# Patient Record
Sex: Male | Born: 1965 | Race: White | Hispanic: No | Marital: Married | Smoking: Current every day smoker
Health system: Southern US, Community
[De-identification: ages and names within clinical notes are randomized; demographics above are authoritative.]

## PROBLEM LIST (undated history)

## (undated) DIAGNOSIS — I10 Essential (primary) hypertension: Secondary | ICD-10-CM

## (undated) DIAGNOSIS — R000 Tachycardia, unspecified: Secondary | ICD-10-CM

## (undated) DIAGNOSIS — E119 Type 2 diabetes mellitus without complications: Secondary | ICD-10-CM

## (undated) DIAGNOSIS — K750 Abscess of liver: Secondary | ICD-10-CM

## (undated) HISTORY — PX: INCISE AND DRAIN ABCESS: PRO64

---

## 2001-07-01 ENCOUNTER — Encounter: Payer: Self-pay | Admitting: Neurosurgery

## 2001-07-03 ENCOUNTER — Ambulatory Visit (HOSPITAL_COMMUNITY): Admission: RE | Admit: 2001-07-03 | Discharge: 2001-07-04 | Payer: Self-pay | Admitting: Neurosurgery

## 2001-07-03 ENCOUNTER — Encounter: Payer: Self-pay | Admitting: Neurosurgery

## 2004-10-02 ENCOUNTER — Inpatient Hospital Stay (HOSPITAL_COMMUNITY): Admission: RE | Admit: 2004-10-02 | Discharge: 2004-10-05 | Payer: Self-pay | Admitting: Psychiatry

## 2004-10-02 ENCOUNTER — Ambulatory Visit: Payer: Self-pay | Admitting: Psychiatry

## 2004-10-09 ENCOUNTER — Other Ambulatory Visit (HOSPITAL_COMMUNITY): Admission: RE | Admit: 2004-10-09 | Discharge: 2005-01-07 | Payer: Self-pay | Admitting: Psychiatry

## 2015-06-19 ENCOUNTER — Other Ambulatory Visit (HOSPITAL_COMMUNITY)
Admission: RE | Admit: 2015-06-19 | Discharge: 2015-06-19 | Disposition: A | Discharge status: Home / Self Care | Payer: 59 | Source: Other Acute Inpatient Hospital | Attending: *Deleted | Admitting: *Deleted

## 2015-06-19 DIAGNOSIS — Z029 Encounter for administrative examinations, unspecified: Secondary | ICD-10-CM | POA: Insufficient documentation

## 2015-06-19 LAB — COMPREHENSIVE METABOLIC PANEL
ALT: 18 U/L (ref 17–63)
AST: 25 U/L (ref 15–41)
Albumin: 2.3 g/dL — ABNORMAL LOW (ref 3.5–5.0)
Alkaline Phosphatase: 111 U/L (ref 38–126)
Anion gap: 8 (ref 5–15)
BUN: 8 mg/dL (ref 6–20)
CO2: 25 mmol/L (ref 22–32)
Calcium: 8.4 mg/dL — ABNORMAL LOW (ref 8.9–10.3)
Chloride: 100 mmol/L — ABNORMAL LOW (ref 101–111)
Creatinine, Ser: 0.59 mg/dL — ABNORMAL LOW (ref 0.61–1.24)
GFR calc Af Amer: >60 mL/min (ref >60)
GFR calc non Af Amer: >60 mL/min (ref >60)
Glucose, Bld: 135 mg/dL — ABNORMAL HIGH (ref 65–99)
Potassium: 4.4 mmol/L (ref 3.5–5.1)
Sodium: 133 mmol/L — ABNORMAL LOW (ref 135–145)
Total Bilirubin: 1.1 mg/dL (ref 0.3–1.2)
Total Protein: 6.4 g/dL — ABNORMAL LOW (ref 6.5–8.1)

## 2015-06-19 LAB — CBC WITH DIFFERENTIAL/PLATELET
Basophils Absolute: 0.1 10*3/uL (ref 0.0–0.1)
Basophils Relative: 1 % (ref 0–1)
Eosinophils Absolute: 0.6 10*3/uL (ref 0.0–0.7)
Eosinophils Relative: 6 % — ABNORMAL HIGH (ref 0–5)
HCT: 37.2 % — ABNORMAL LOW (ref 39.0–52.0)
Hemoglobin: 12.2 g/dL — ABNORMAL LOW (ref 13.0–17.0)
Lymphocytes Relative: 16 % (ref 12–46)
Lymphs Abs: 1.8 10*3/uL (ref 0.7–4.0)
MCH: 34.2 pg — ABNORMAL HIGH (ref 26.0–34.0)
MCHC: 32.8 g/dL (ref 30.0–36.0)
MCV: 104.2 fL — ABNORMAL HIGH (ref 78.0–100.0)
Monocytes Absolute: 1.1 10*3/uL — ABNORMAL HIGH (ref 0.1–1.0)
Monocytes Relative: 10 % (ref 3–12)
Neutro Abs: 7.3 10*3/uL (ref 1.7–7.7)
Neutrophils Relative %: 67 % (ref 43–77)
Platelets: 524 10*3/uL — ABNORMAL HIGH (ref 150–400)
RBC: 3.57 MIL/uL — ABNORMAL LOW (ref 4.22–5.81)
RDW: 13.5 % (ref 11.5–15.5)
WBC: 11 10*3/uL — ABNORMAL HIGH (ref 4.0–10.5)

## 2015-06-19 LAB — SEDIMENTATION RATE: Sed Rate: 120 mm/hr — ABNORMAL HIGH (ref 0–16)

## 2015-06-26 ENCOUNTER — Other Ambulatory Visit (HOSPITAL_COMMUNITY)
Admission: RE | Admit: 2015-06-26 | Discharge: 2015-06-26 | Disposition: A | Discharge status: Home / Self Care | Payer: 59 | Source: Other Acute Inpatient Hospital | Attending: Cardiovascular Disease | Admitting: Cardiovascular Disease

## 2015-06-26 DIAGNOSIS — Z5181 Encounter for therapeutic drug level monitoring: Secondary | ICD-10-CM | POA: Diagnosis not present

## 2015-06-26 DIAGNOSIS — Z452 Encounter for adjustment and management of vascular access device: Secondary | ICD-10-CM | POA: Diagnosis not present

## 2015-06-26 LAB — CBC
HCT: 39.4 % (ref 39.0–52.0)
HEMOGLOBIN: 13.2 g/dL (ref 13.0–17.0)
MCH: 34.6 pg — AB (ref 26.0–34.0)
MCHC: 33.5 g/dL (ref 30.0–36.0)
MCV: 103.1 fL — AB (ref 78.0–100.0)
PLATELETS: 391 10*3/uL (ref 150–400)
RBC: 3.82 MIL/uL — AB (ref 4.22–5.81)
RDW: 13.2 % (ref 11.5–15.5)
WBC: 11.3 10*3/uL — ABNORMAL HIGH (ref 4.0–10.5)

## 2015-06-26 LAB — COMPREHENSIVE METABOLIC PANEL
ALBUMIN: 2.8 g/dL — AB (ref 3.5–5.0)
ALK PHOS: 91 U/L (ref 38–126)
ALT: 16 U/L — AB (ref 17–63)
AST: 22 U/L (ref 15–41)
Anion gap: 7 (ref 5–15)
BUN: 8 mg/dL (ref 6–20)
CALCIUM: 8.5 mg/dL — AB (ref 8.9–10.3)
CHLORIDE: 100 mmol/L — AB (ref 101–111)
CO2: 26 mmol/L (ref 22–32)
CREATININE: 0.51 mg/dL — AB (ref 0.61–1.24)
GFR calc non Af Amer: >60 mL/min (ref >60)
GLUCOSE: 90 mg/dL (ref 65–99)
Potassium: 4.1 mmol/L (ref 3.5–5.1)
SODIUM: 133 mmol/L — AB (ref 135–145)
Total Bilirubin: 0.5 mg/dL (ref 0.3–1.2)
Total Protein: 6.9 g/dL (ref 6.5–8.1)

## 2015-06-26 LAB — C-REACTIVE PROTEIN: CRP: 1.5 mg/dL — AB (ref <1.0)

## 2015-06-26 LAB — SEDIMENTATION RATE: Sed Rate: 82 mm/hr — ABNORMAL HIGH (ref 0–16)

## 2017-03-06 ENCOUNTER — Inpatient Hospital Stay (HOSPITAL_COMMUNITY): Payer: 59

## 2017-03-06 ENCOUNTER — Inpatient Hospital Stay (HOSPITAL_COMMUNITY)
Admission: EM | Admit: 2017-03-06 | Discharge: 2017-03-21 | DRG: 871 | Disposition: E | Payer: 59 | Attending: Pulmonary Disease | Admitting: Pulmonary Disease

## 2017-03-06 ENCOUNTER — Emergency Department (HOSPITAL_COMMUNITY): Payer: 59

## 2017-03-06 ENCOUNTER — Encounter (HOSPITAL_COMMUNITY): Payer: Self-pay

## 2017-03-06 DIAGNOSIS — R0902 Hypoxemia: Secondary | ICD-10-CM | POA: Diagnosis present

## 2017-03-06 DIAGNOSIS — R6521 Severe sepsis with septic shock: Secondary | ICD-10-CM | POA: Diagnosis present

## 2017-03-06 DIAGNOSIS — D696 Thrombocytopenia, unspecified: Secondary | ICD-10-CM | POA: Diagnosis present

## 2017-03-06 DIAGNOSIS — I2510 Atherosclerotic heart disease of native coronary artery without angina pectoris: Secondary | ICD-10-CM | POA: Diagnosis present

## 2017-03-06 DIAGNOSIS — D649 Anemia, unspecified: Secondary | ICD-10-CM | POA: Diagnosis present

## 2017-03-06 DIAGNOSIS — Z7984 Long term (current) use of oral hypoglycemic drugs: Secondary | ICD-10-CM

## 2017-03-06 DIAGNOSIS — Z9861 Coronary angioplasty status: Secondary | ICD-10-CM | POA: Diagnosis not present

## 2017-03-06 DIAGNOSIS — Z452 Encounter for adjustment and management of vascular access device: Secondary | ICD-10-CM

## 2017-03-06 DIAGNOSIS — I421 Obstructive hypertrophic cardiomyopathy: Secondary | ICD-10-CM | POA: Diagnosis present

## 2017-03-06 DIAGNOSIS — A419 Sepsis, unspecified organism: Principal | ICD-10-CM | POA: Diagnosis present

## 2017-03-06 DIAGNOSIS — I10 Essential (primary) hypertension: Secondary | ICD-10-CM | POA: Diagnosis present

## 2017-03-06 DIAGNOSIS — K7200 Acute and subacute hepatic failure without coma: Secondary | ICD-10-CM | POA: Diagnosis not present

## 2017-03-06 DIAGNOSIS — I252 Old myocardial infarction: Secondary | ICD-10-CM | POA: Diagnosis not present

## 2017-03-06 DIAGNOSIS — R0600 Dyspnea, unspecified: Secondary | ICD-10-CM | POA: Diagnosis not present

## 2017-03-06 DIAGNOSIS — Z66 Do not resuscitate: Secondary | ICD-10-CM | POA: Diagnosis present

## 2017-03-06 DIAGNOSIS — Z7982 Long term (current) use of aspirin: Secondary | ICD-10-CM

## 2017-03-06 DIAGNOSIS — E874 Mixed disorder of acid-base balance: Secondary | ICD-10-CM | POA: Diagnosis present

## 2017-03-06 DIAGNOSIS — Z8249 Family history of ischemic heart disease and other diseases of the circulatory system: Secondary | ICD-10-CM

## 2017-03-06 DIAGNOSIS — N170 Acute kidney failure with tubular necrosis: Secondary | ICD-10-CM | POA: Diagnosis present

## 2017-03-06 DIAGNOSIS — E872 Acidosis, unspecified: Secondary | ICD-10-CM | POA: Insufficient documentation

## 2017-03-06 DIAGNOSIS — Z6841 Body Mass Index (BMI) 40.0 and over, adult: Secondary | ICD-10-CM | POA: Diagnosis not present

## 2017-03-06 DIAGNOSIS — Q244 Congenital subaortic stenosis: Secondary | ICD-10-CM | POA: Diagnosis not present

## 2017-03-06 DIAGNOSIS — R001 Bradycardia, unspecified: Secondary | ICD-10-CM

## 2017-03-06 DIAGNOSIS — G9341 Metabolic encephalopathy: Secondary | ICD-10-CM | POA: Diagnosis present

## 2017-03-06 DIAGNOSIS — K651 Peritoneal abscess: Secondary | ICD-10-CM | POA: Diagnosis present

## 2017-03-06 DIAGNOSIS — F1020 Alcohol dependence, uncomplicated: Secondary | ICD-10-CM | POA: Diagnosis present

## 2017-03-06 DIAGNOSIS — G9340 Encephalopathy, unspecified: Secondary | ICD-10-CM | POA: Diagnosis not present

## 2017-03-06 DIAGNOSIS — Z515 Encounter for palliative care: Secondary | ICD-10-CM | POA: Diagnosis not present

## 2017-03-06 DIAGNOSIS — J9601 Acute respiratory failure with hypoxia: Secondary | ICD-10-CM | POA: Diagnosis present

## 2017-03-06 DIAGNOSIS — E119 Type 2 diabetes mellitus without complications: Secondary | ICD-10-CM | POA: Diagnosis present

## 2017-03-06 DIAGNOSIS — K559 Vascular disorder of intestine, unspecified: Secondary | ICD-10-CM | POA: Diagnosis present

## 2017-03-06 DIAGNOSIS — E875 Hyperkalemia: Secondary | ICD-10-CM | POA: Diagnosis present

## 2017-03-06 DIAGNOSIS — I248 Other forms of acute ischemic heart disease: Secondary | ICD-10-CM | POA: Diagnosis not present

## 2017-03-06 DIAGNOSIS — F17210 Nicotine dependence, cigarettes, uncomplicated: Secondary | ICD-10-CM | POA: Diagnosis present

## 2017-03-06 DIAGNOSIS — I459 Conduction disorder, unspecified: Secondary | ICD-10-CM | POA: Diagnosis present

## 2017-03-06 DIAGNOSIS — N179 Acute kidney failure, unspecified: Secondary | ICD-10-CM | POA: Diagnosis not present

## 2017-03-06 DIAGNOSIS — N19 Unspecified kidney failure: Secondary | ICD-10-CM | POA: Insufficient documentation

## 2017-03-06 HISTORY — DX: Abscess of liver: K75.0

## 2017-03-06 HISTORY — DX: Essential (primary) hypertension: I10

## 2017-03-06 HISTORY — DX: Tachycardia, unspecified: R00.0

## 2017-03-06 HISTORY — DX: Type 2 diabetes mellitus without complications: E11.9

## 2017-03-06 LAB — BLOOD GAS, ARTERIAL
Acid-base deficit: 26.4 mmol/L — ABNORMAL HIGH (ref 0.0–2.0)
Acid-base deficit: 23.4 mmol/L — ABNORMAL HIGH (ref 0.0–2.0)
BICARBONATE: 4.4 mmol/L — AB (ref 20.0–28.0)
BICARBONATE: 6.1 mmol/L — AB (ref 20.0–28.0)
Drawn by: 404151
Drawn by: 27022
FIO2: 50
FIO2: 0.5
LHR: 35 {breaths}/min
LHR: 35 {breaths}/min
MECHVT: 600 mL
MECHVT: 600 mL
O2 SAT: 94.8 %
O2 Saturation: 92 %
PATIENT TEMPERATURE: 95.9
PCO2 ART: 27.4 mmHg — AB (ref 32.0–48.0)
PEEP/CPAP: 5 cmH2O
PEEP: 5 cmH2O
PO2 ART: 88.5 mmHg (ref 83.0–108.0)
Patient temperature: 98.6
pCO2 arterial: 28.4 mmHg — ABNORMAL LOW (ref 32.0–48.0)
pH, Arterial: 6.82 — CL (ref 7.350–7.450)
pH, Arterial: 6.966 — CL (ref 7.350–7.450)
pO2, Arterial: 91.1 mmHg (ref 83.0–108.0)

## 2017-03-06 LAB — I-STAT CG4 LACTIC ACID, ED: LACTIC ACID, VENOUS: 15.01 mmol/L — AB (ref 0.5–1.9)

## 2017-03-06 LAB — RENAL FUNCTION PANEL
Albumin: 2.4 g/dL — ABNORMAL LOW (ref 3.5–5.0)
BUN: 73 mg/dL — ABNORMAL HIGH (ref 6–20)
CALCIUM: 6.7 mg/dL — AB (ref 8.9–10.3)
CHLORIDE: 95 mmol/L — AB (ref 101–111)
CREATININE: 11.32 mg/dL — AB (ref 0.61–1.24)
GFR calc Af Amer: 5 mL/min — ABNORMAL LOW (ref >60)
GFR calc non Af Amer: 5 mL/min — ABNORMAL LOW (ref >60)
GLUCOSE: 241 mg/dL — AB (ref 65–99)
Phosphorus: 16.2 mg/dL — ABNORMAL HIGH (ref 2.5–4.6)
Potassium: >7.5 mmol/L (ref 3.5–5.1)
SODIUM: 135 mmol/L (ref 135–145)

## 2017-03-06 LAB — CBC WITH DIFFERENTIAL/PLATELET
BASOS PCT: 0 %
Basophils Absolute: 0 10*3/uL (ref 0.0–0.1)
EOS ABS: 0 10*3/uL (ref 0.0–0.7)
EOS PCT: 0 %
HCT: 31.1 % — ABNORMAL LOW (ref 39.0–52.0)
Hemoglobin: 9.2 g/dL — ABNORMAL LOW (ref 13.0–17.0)
LYMPHS ABS: 1.7 10*3/uL (ref 0.7–4.0)
Lymphocytes Relative: 30 %
MCH: 36.5 pg — AB (ref 26.0–34.0)
MCHC: 29.6 g/dL — ABNORMAL LOW (ref 30.0–36.0)
MCV: 123.4 fL — ABNORMAL HIGH (ref 78.0–100.0)
MONO ABS: 0.4 10*3/uL (ref 0.1–1.0)
Monocytes Relative: 7 %
Neutro Abs: 3.5 10*3/uL (ref 1.7–7.7)
Neutrophils Relative %: 63 %
PLATELETS: 89 10*3/uL — AB (ref 150–400)
RBC: 2.52 MIL/uL — AB (ref 4.22–5.81)
RDW: 12.8 % (ref 11.5–15.5)
WBC: 5.6 10*3/uL (ref 4.0–10.5)

## 2017-03-06 LAB — CBC
HCT: 32.9 % — ABNORMAL LOW (ref 39.0–52.0)
HEMOGLOBIN: 10.1 g/dL — AB (ref 13.0–17.0)
MCH: 36.9 pg — AB (ref 26.0–34.0)
MCHC: 30.7 g/dL (ref 30.0–36.0)
MCV: 120.1 fL — ABNORMAL HIGH (ref 78.0–100.0)
PLATELETS: 85 10*3/uL — AB (ref 150–400)
RBC: 2.74 MIL/uL — AB (ref 4.22–5.81)
RDW: 13.1 % (ref 11.5–15.5)
WBC: 8.1 10*3/uL (ref 4.0–10.5)

## 2017-03-06 LAB — COMPREHENSIVE METABOLIC PANEL
ALBUMIN: 2.6 g/dL — AB (ref 3.5–5.0)
ALT: 50 U/L (ref 17–63)
ALT: 58 U/L (ref 17–63)
AST: 103 U/L — ABNORMAL HIGH (ref 15–41)
AST: 137 U/L — ABNORMAL HIGH (ref 15–41)
Albumin: 2.5 g/dL — ABNORMAL LOW (ref 3.5–5.0)
Alkaline Phosphatase: 100 U/L (ref 38–126)
Alkaline Phosphatase: 109 U/L (ref 38–126)
BUN: 75 mg/dL — ABNORMAL HIGH (ref 6–20)
BUN: 74 mg/dL — ABNORMAL HIGH (ref 6–20)
CHLORIDE: 97 mmol/L — AB (ref 101–111)
CHLORIDE: 99 mmol/L — AB (ref 101–111)
CO2: <7 mmol/L — ABNORMAL LOW (ref 22–32)
Calcium: 7.5 mg/dL — ABNORMAL LOW (ref 8.9–10.3)
Calcium: 7.3 mg/dL — ABNORMAL LOW (ref 8.9–10.3)
Creatinine, Ser: 12.4 mg/dL — ABNORMAL HIGH (ref 0.61–1.24)
Creatinine, Ser: 11.88 mg/dL — ABNORMAL HIGH (ref 0.61–1.24)
GFR calc Af Amer: 5 mL/min — ABNORMAL LOW (ref >60)
GFR calc Af Amer: 5 mL/min — ABNORMAL LOW (ref >60)
GFR calc non Af Amer: 4 mL/min — ABNORMAL LOW (ref >60)
GFR, EST NON AFRICAN AMERICAN: 4 mL/min — AB (ref >60)
Glucose, Bld: 115 mg/dL — ABNORMAL HIGH (ref 65–99)
Glucose, Bld: 155 mg/dL — ABNORMAL HIGH (ref 65–99)
SODIUM: 136 mmol/L (ref 135–145)
Sodium: 138 mmol/L (ref 135–145)
Total Bilirubin: 1.3 mg/dL — ABNORMAL HIGH (ref 0.3–1.2)
Total Bilirubin: 1.7 mg/dL — ABNORMAL HIGH (ref 0.3–1.2)
Total Protein: 5 g/dL — ABNORMAL LOW (ref 6.5–8.1)
Total Protein: 5.1 g/dL — ABNORMAL LOW (ref 6.5–8.1)

## 2017-03-06 LAB — I-STAT CHEM 8, ED
BUN: 86 mg/dL — ABNORMAL HIGH (ref 6–20)
CALCIUM ION: 0.82 mmol/L — AB (ref 1.15–1.40)
Chloride: 106 mmol/L (ref 101–111)
Creatinine, Ser: 13.5 mg/dL — ABNORMAL HIGH (ref 0.61–1.24)
GLUCOSE: 71 mg/dL (ref 65–99)
HCT: 46 % (ref 39.0–52.0)
HEMOGLOBIN: 15.6 g/dL (ref 13.0–17.0)
Potassium: 7 mmol/L (ref 3.5–5.1)
SODIUM: 132 mmol/L — AB (ref 135–145)
TCO2: 5 mmol/L (ref 0–100)

## 2017-03-06 LAB — GLUCOSE, CAPILLARY
Glucose-Capillary: 244 mg/dL — ABNORMAL HIGH (ref 65–99)
Glucose-Capillary: 261 mg/dL — ABNORMAL HIGH (ref 65–99)
Glucose-Capillary: 303 mg/dL — ABNORMAL HIGH (ref 65–99)

## 2017-03-06 LAB — LIPASE, BLOOD
LIPASE: 770 U/L — AB (ref 11–51)
Lipase: 755 U/L — ABNORMAL HIGH (ref 11–51)

## 2017-03-06 LAB — APTT: APTT: 56 s — AB (ref 24–36)

## 2017-03-06 LAB — LACTIC ACID, PLASMA
LACTIC ACID, VENOUS: 15.7 mmol/L — AB (ref 0.5–1.9)
Lactic Acid, Venous: 15 mmol/L (ref 0.5–1.9)

## 2017-03-06 LAB — I-STAT ARTERIAL BLOOD GAS, ED
ACID-BASE DEFICIT: 29 mmol/L — AB (ref 0.0–2.0)
BICARBONATE: 5.5 mmol/L — AB (ref 20.0–28.0)
O2 Saturation: 100 %
PH ART: 6.728 — AB (ref 7.350–7.450)
TCO2: 7 mmol/L (ref 0–100)
pCO2 arterial: 41.4 mmHg (ref 32.0–48.0)
pO2, Arterial: 363 mmHg — ABNORMAL HIGH (ref 83.0–108.0)

## 2017-03-06 LAB — ETHANOL: ALCOHOL ETHYL (B): 186 mg/dL — AB (ref <5)

## 2017-03-06 LAB — LACTATE DEHYDROGENASE: LDH: 367 U/L — AB (ref 98–192)

## 2017-03-06 LAB — TROPONIN I: TROPONIN I: 0.09 ng/mL — AB (ref <0.03)

## 2017-03-06 LAB — MAGNESIUM: Magnesium: 2.6 mg/dL — ABNORMAL HIGH (ref 1.7–2.4)

## 2017-03-06 LAB — AMYLASE: AMYLASE: 527 U/L — AB (ref 28–100)

## 2017-03-06 LAB — PHOSPHORUS: Phosphorus: 14.7 mg/dL — ABNORMAL HIGH (ref 2.5–4.6)

## 2017-03-06 LAB — TYPE AND SCREEN
ABO/RH(D): A POS
Antibody Screen: NEGATIVE

## 2017-03-06 LAB — PROCALCITONIN: PROCALCITONIN: 0.39 ng/mL

## 2017-03-06 LAB — I-STAT TROPONIN, ED: Troponin i, poc: 0.13 ng/mL (ref 0.00–0.08)

## 2017-03-06 LAB — PROTIME-INR
INR: 2.29
Prothrombin Time: 25.6 seconds — ABNORMAL HIGH (ref 11.4–15.2)

## 2017-03-06 LAB — CORTISOL: Cortisol, Plasma: 25.5 ug/dL

## 2017-03-06 LAB — ABO/RH: ABO/RH(D): A POS

## 2017-03-06 LAB — CBG MONITORING, ED: GLUCOSE-CAPILLARY: 62 mg/dL — AB (ref 65–99)

## 2017-03-06 MED ORDER — SODIUM CHLORIDE 0.9 % IV BOLUS (SEPSIS)
1000.0000 mL | Freq: Once | INTRAVENOUS | Status: AC
  Administered 2017-03-06: 1000 mL via INTRAVENOUS

## 2017-03-06 MED ORDER — PANTOPRAZOLE SODIUM 40 MG IV SOLR
40.0000 mg | Freq: Every day | INTRAVENOUS | Status: DC
  Administered 2017-03-06 – 2017-03-07 (×2): 40 mg via INTRAVENOUS
  Filled 2017-03-06 (×2): qty 40

## 2017-03-06 MED ORDER — SODIUM CHLORIDE 0.9 % IV SOLN
5.0000 ng/kg/min | INTRAVENOUS | Status: DC
  Filled 2017-03-06 (×2): qty 1

## 2017-03-06 MED ORDER — DEXTROSE 5 % IV SOLN
2.0000 g | Freq: Two times a day (BID) | INTRAVENOUS | Status: DC
  Filled 2017-03-06: qty 2

## 2017-03-06 MED ORDER — SODIUM BICARBONATE 8.4 % IV SOLN
50.0000 meq | Freq: Once | INTRAVENOUS | Status: AC
  Administered 2017-03-06: 50 meq via INTRAVENOUS

## 2017-03-06 MED ORDER — CALCIUM CHLORIDE 10 % IV SOLN: 1.0000 g | Freq: Once | INTRAVENOUS | Status: DC

## 2017-03-06 MED ORDER — SODIUM BICARBONATE 8.4 % IV SOLN
200.0000 meq | Freq: Once | INTRAVENOUS | Status: AC
Start: 2017-03-06 — End: 2017-03-06
  Administered 2017-03-06: 200 meq via INTRAVENOUS
  Filled 2017-03-06: qty 200

## 2017-03-06 MED ORDER — MIDAZOLAM HCL 2 MG/2ML IJ SOLN
2.0000 mg | INTRAMUSCULAR | Status: AC | PRN
  Administered 2017-03-07 (×3): 2 mg via INTRAVENOUS
  Filled 2017-03-06 (×3): qty 2

## 2017-03-06 MED ORDER — SODIUM CHLORIDE 0.9 % FOR CRRT
INTRAVENOUS_CENTRAL | Status: DC | PRN
  Filled 2017-03-06: qty 1000

## 2017-03-06 MED ORDER — NOREPINEPHRINE BITARTRATE 1 MG/ML IV SOLN
0.0000 ug/min | INTRAVENOUS | Status: DC
  Administered 2017-03-06: 30 ug/min via INTRAVENOUS
  Administered 2017-03-06: 2 ug/min via INTRAVENOUS
  Filled 2017-03-06 (×2): qty 4

## 2017-03-06 MED ORDER — ATROPINE SULFATE 1 MG/10ML IJ SOSY
PREFILLED_SYRINGE | INTRAMUSCULAR | Status: AC
  Filled 2017-03-06: qty 10

## 2017-03-06 MED ORDER — SODIUM BICARBONATE 8.4 % IV SOLN
INTRAVENOUS | Status: AC | PRN
  Administered 2017-03-06: 50 meq via INTRAVENOUS

## 2017-03-06 MED ORDER — SODIUM BICARBONATE 8.4 % IV SOLN
200.0000 meq | Freq: Once | INTRAVENOUS | Status: AC
  Administered 2017-03-06: 200 meq via INTRAVENOUS
  Filled 2017-03-06: qty 200

## 2017-03-06 MED ORDER — PROPOFOL 1000 MG/100ML IV EMUL: 5.0000 ug/kg/min | Freq: Once | INTRAVENOUS | Status: DC

## 2017-03-06 MED ORDER — CHLORHEXIDINE GLUCONATE 0.12% ORAL RINSE (MEDLINE KIT)
15.0000 mL | Freq: Two times a day (BID) | OROMUCOSAL | Status: DC
  Administered 2017-03-06 – 2017-03-08 (×4): 15 mL via OROMUCOSAL

## 2017-03-06 MED ORDER — HEPARIN SODIUM (PORCINE) 5000 UNIT/ML IJ SOLN
5000.0000 [IU] | Freq: Three times a day (TID) | INTRAMUSCULAR | Status: DC
  Administered 2017-03-06 – 2017-03-07 (×2): 5000 [IU] via SUBCUTANEOUS
  Filled 2017-03-06 (×3): qty 1

## 2017-03-06 MED ORDER — VASOPRESSIN 20 UNIT/ML IV SOLN
0.0300 [IU]/min | INTRAVENOUS | Status: DC
  Administered 2017-03-06 – 2017-03-08 (×3): 0.03 [IU]/min via INTRAVENOUS
  Filled 2017-03-06 (×3): qty 2

## 2017-03-06 MED ORDER — EPINEPHRINE PF 1 MG/ML IJ SOLN
0.5000 ug/min | INTRAMUSCULAR | Status: DC
  Administered 2017-03-06 – 2017-03-07 (×2): 10 ug/min via INTRAVENOUS
  Administered 2017-03-07 (×2): 20 ug/min via INTRAVENOUS
  Filled 2017-03-06 (×5): qty 4

## 2017-03-06 MED ORDER — DEXTROSE 50 % IV SOLN: 50.0000 mL | Freq: Once | INTRAVENOUS | Status: DC

## 2017-03-06 MED ORDER — NOREPINEPHRINE BITARTRATE 1 MG/ML IV SOLN
0.0000 ug/min | INTRAVENOUS | Status: DC
  Administered 2017-03-06 (×3): 125 ug/min via INTRAVENOUS
  Administered 2017-03-06: 40 ug/min via INTRAVENOUS
  Administered 2017-03-07: 65 ug/min via INTRAVENOUS
  Administered 2017-03-07 (×2): 125 ug/min via INTRAVENOUS
  Administered 2017-03-07: 60 ug/min via INTRAVENOUS
  Administered 2017-03-07: 40 ug/min via INTRAVENOUS
  Administered 2017-03-07: 125 ug/min via INTRAVENOUS
  Administered 2017-03-08: 80 ug/min via INTRAVENOUS
  Administered 2017-03-08 (×2): 100 ug/min via INTRAVENOUS
  Filled 2017-03-06 (×14): qty 16

## 2017-03-06 MED ORDER — FENTANYL CITRATE (PF) 100 MCG/2ML IJ SOLN
INTRAMUSCULAR | Status: AC | PRN
  Administered 2017-03-06: 100 ug via INTRAVENOUS

## 2017-03-06 MED ORDER — SODIUM BICARBONATE 8.4 % IV SOLN
INTRAVENOUS | Status: AC
  Administered 2017-03-06: 15:00:00
  Filled 2017-03-06: qty 50

## 2017-03-06 MED ORDER — SUCCINYLCHOLINE CHLORIDE 20 MG/ML IJ SOLN
INTRAMUSCULAR | Status: AC | PRN
  Administered 2017-03-06: 200 mg via INTRAVENOUS

## 2017-03-06 MED ORDER — DEXTROSE 5 % IV SOLN: 1.0000 g | INTRAVENOUS | Status: DC

## 2017-03-06 MED ORDER — VANCOMYCIN HCL 10 G IV SOLR
2500.0000 mg | Freq: Once | INTRAVENOUS | Status: AC
  Administered 2017-03-06: 2500 mg via INTRAVENOUS
  Filled 2017-03-06: qty 2500

## 2017-03-06 MED ORDER — PRISMASOL BGK 0/2.5 32-2.5 MEQ/L IV SOLN
INTRAVENOUS | Status: DC
  Administered 2017-03-06: 17:00:00 via INTRAVENOUS_CENTRAL
  Filled 2017-03-06 (×2): qty 5000

## 2017-03-06 MED ORDER — SODIUM BICARBONATE 8.4 % IV SOLN
INTRAVENOUS | Status: AC
  Filled 2017-03-06: qty 50

## 2017-03-06 MED ORDER — SODIUM CHLORIDE 0.9 % IV BOLUS (SEPSIS)
500.0000 mL | Freq: Once | INTRAVENOUS | Status: AC
  Administered 2017-03-06: 500 mL via INTRAVENOUS

## 2017-03-06 MED ORDER — SODIUM CHLORIDE 0.9 % IV SOLN
100.0000 mg | Freq: Once | INTRAVENOUS | Status: AC
  Administered 2017-03-06: 100 mg via INTRAVENOUS
  Filled 2017-03-06: qty 100

## 2017-03-06 MED ORDER — NALOXONE HCL 2 MG/2ML IJ SOSY
PREFILLED_SYRINGE | INTRAMUSCULAR | Status: AC
  Filled 2017-03-06: qty 2

## 2017-03-06 MED ORDER — HYDROCORTISONE NA SUCCINATE PF 100 MG IJ SOLR
100.0000 mg | Freq: Three times a day (TID) | INTRAMUSCULAR | Status: DC
  Administered 2017-03-06 – 2017-03-07 (×3): 100 mg via INTRAVENOUS
  Filled 2017-03-06 (×4): qty 2

## 2017-03-06 MED ORDER — SODIUM CHLORIDE 0.9 % IV SOLN: 50.0000 mg | INTRAVENOUS | Status: DC

## 2017-03-06 MED ORDER — SODIUM CHLORIDE 0.9 % IV SOLN: 250.0000 mL | INTRAVENOUS | Status: DC | PRN

## 2017-03-06 MED ORDER — MIDAZOLAM HCL 2 MG/2ML IJ SOLN
2.0000 mg | INTRAMUSCULAR | Status: DC | PRN
  Administered 2017-03-06 – 2017-03-08 (×10): 2 mg via INTRAVENOUS
  Filled 2017-03-06 (×10): qty 2

## 2017-03-06 MED ORDER — STERILE WATER FOR INJECTION IV SOLN
INTRAVENOUS | Status: DC
  Administered 2017-03-07 – 2017-03-08 (×5): via INTRAVENOUS_CENTRAL
  Filled 2017-03-06 (×10): qty 300

## 2017-03-06 MED ORDER — DEXTROSE 5 % IV SOLN
INTRAVENOUS | Status: DC
  Administered 2017-03-06 – 2017-03-08 (×6): via INTRAVENOUS
  Filled 2017-03-06 (×10): qty 150

## 2017-03-06 MED ORDER — STERILE WATER FOR INJECTION IV SOLN
INTRAVENOUS | Status: DC
  Filled 2017-03-06 (×4): qty 150

## 2017-03-06 MED ORDER — PRISMASOL BGK 0/2.5 32-2.5 MEQ/L IV SOLN
INTRAVENOUS | Status: DC
Start: 2017-03-06 — End: 2017-03-07
  Administered 2017-03-06 – 2017-03-07 (×3): via INTRAVENOUS_CENTRAL
  Filled 2017-03-06 (×2): qty 5000

## 2017-03-06 MED ORDER — DEXTROSE 5 % IV SOLN
2.0000 g | Freq: Once | INTRAVENOUS | Status: AC
  Administered 2017-03-06: 2 g via INTRAVENOUS

## 2017-03-06 MED ORDER — SODIUM CHLORIDE 0.9 % IV SOLN
1.0000 g | Freq: Two times a day (BID) | INTRAVENOUS | Status: DC
  Administered 2017-03-06 – 2017-03-08 (×4): 1 g via INTRAVENOUS
  Filled 2017-03-06 (×4): qty 1

## 2017-03-06 MED ORDER — PRISMASOL BGK 0/2.5 32-2.5 MEQ/L IV SOLN
INTRAVENOUS | Status: DC
  Administered 2017-03-06 – 2017-03-07 (×5): via INTRAVENOUS_CENTRAL
  Filled 2017-03-06 (×11): qty 5000

## 2017-03-06 MED ORDER — FENTANYL 2500MCG IN NS 250ML (10MCG/ML) PREMIX INFUSION
25.0000 ug/h | INTRAVENOUS | Status: DC
  Administered 2017-03-06: 25 ug/h via INTRAVENOUS
  Administered 2017-03-07 – 2017-03-08 (×2): 200 ug/h via INTRAVENOUS
  Filled 2017-03-06 (×4): qty 250

## 2017-03-06 MED ORDER — VANCOMYCIN HCL 10 G IV SOLR
1250.0000 mg | INTRAVENOUS | Status: DC
  Administered 2017-03-07 – 2017-03-08 (×2): 1250 mg via INTRAVENOUS
  Filled 2017-03-06 (×2): qty 1250

## 2017-03-06 MED ORDER — SODIUM CHLORIDE 0.9 % IV SOLN
Freq: Once | INTRAVENOUS | Status: AC
  Administered 2017-03-06: 08:00:00 via INTRAVENOUS

## 2017-03-06 MED ORDER — INSULIN ASPART 100 UNIT/ML ~~LOC~~ SOLN
0.0000 [IU] | SUBCUTANEOUS | Status: DC
  Administered 2017-03-06: 5 [IU] via SUBCUTANEOUS
  Administered 2017-03-06: 7 [IU] via SUBCUTANEOUS
  Administered 2017-03-07: 9 [IU] via SUBCUTANEOUS
  Administered 2017-03-07: 7 [IU] via SUBCUTANEOUS

## 2017-03-06 MED ORDER — DEXTROSE 50 % IV SOLN
INTRAVENOUS | Status: AC | PRN
  Administered 2017-03-06: 1 via INTRAVENOUS

## 2017-03-06 MED ORDER — ROCURONIUM BROMIDE 50 MG/5ML IV SOLN
INTRAVENOUS | Status: AC | PRN
  Administered 2017-03-06: 50 mg via INTRAVENOUS

## 2017-03-06 MED ORDER — THIAMINE HCL 100 MG/ML IJ SOLN
100.0000 mg | Freq: Once | INTRAMUSCULAR | Status: AC
  Administered 2017-03-06: 100 mg via INTRAVENOUS
  Filled 2017-03-06: qty 2

## 2017-03-06 MED ORDER — ORAL CARE MOUTH RINSE
15.0000 mL | Freq: Four times a day (QID) | OROMUCOSAL | Status: DC
  Administered 2017-03-06 – 2017-03-08 (×6): 15 mL via OROMUCOSAL

## 2017-03-06 MED ORDER — FENTANYL CITRATE (PF) 100 MCG/2ML IJ SOLN
INTRAMUSCULAR | Status: AC
  Filled 2017-03-06: qty 2

## 2017-03-06 MED ORDER — NALOXONE HCL 2 MG/2ML IJ SOSY
PREFILLED_SYRINGE | INTRAMUSCULAR | Status: AC | PRN
  Administered 2017-03-06: 2 mg via INTRAVENOUS

## 2017-03-06 MED ORDER — SODIUM BICARBONATE 8.4 % IV SOLN: 200.0000 meq | Freq: Once | INTRAVENOUS | Status: DC

## 2017-03-06 MED ORDER — FENTANYL BOLUS VIA INFUSION
50.0000 ug | INTRAVENOUS | Status: DC | PRN
  Administered 2017-03-07: 50 ug via INTRAVENOUS
  Filled 2017-03-06: qty 50

## 2017-03-06 MED ORDER — HEPARIN SODIUM (PORCINE) 1000 UNIT/ML DIALYSIS
1000.0000 [IU] | INTRAMUSCULAR | Status: DC | PRN
  Filled 2017-03-06: qty 6

## 2017-03-06 MED ORDER — CALCIUM CHLORIDE 10 % IV SOLN
INTRAVENOUS | Status: AC | PRN
  Administered 2017-03-06: 1 g via INTRAVENOUS

## 2017-03-06 MED ORDER — SODIUM BICARBONATE 8.4 % IV SOLN
200.0000 meq | Freq: Once | INTRAVENOUS | Status: DC
  Filled 2017-03-06: qty 200

## 2017-03-06 MED ORDER — PANTOPRAZOLE SODIUM 40 MG IV SOLR: 40.0000 mg | Freq: Every day | INTRAVENOUS | Status: DC

## 2017-03-06 MED ORDER — FENTANYL CITRATE (PF) 100 MCG/2ML IJ SOLN: 50.0000 ug | Freq: Once | INTRAMUSCULAR | Status: DC

## 2017-03-06 MED ORDER — ETOMIDATE 2 MG/ML IV SOLN
INTRAVENOUS | Status: AC | PRN
  Administered 2017-03-06: 20 mg via INTRAVENOUS

## 2017-03-06 NOTE — Progress Notes (Signed)
Dr Tyson AliasFeinstein aware of ABG results = at bedside.  Per MD, increase set vent rate to 35 s/p ABG= done.

## 2017-03-06 NOTE — Progress Notes (Signed)
Patient is poorly responsive to pressors.  Called back by bed side RN.  Family bedside.  Spoke with wife who is a Transport plannerpathology tech.  Informed her that he has a severe pneumonia and that he is in multiorgan system failure.  Informed her that his chances of survival are very slim here and that he is in renal failure.  Spoke with renal and they would dialyze if family wishes for aggressive measures.  After a long conversation, decision was made to make patient a LCB with no CPR/cardioversion and short term intubation and dialysis.  If he is not improving then likely withdraw.  Will make LCB.  Dr. Hyman HopesWebb will place CRRT orders and PCCM will place HD catheter.  The patient is critically ill with multiple organ systems failure and requires high complexity decision making for assessment and support, frequent evaluation and titration of therapies, application of advanced monitoring technologies and extensive interpretation of multiple databases.   Critical Care Time devoted to patient care services described in this note is  60  Minutes. This time reflects time of care of this signee Dr Koren BoundWesam Yacoub. This critical care time does not reflect procedure time, or teaching time or supervisory time of PA/NP/Med student/Med Resident etc but could involve care discussion time.  Alyson ReedyWesam G. Yacoub, M.D. [facility]. Pager: [phone]. After hours pager: [phone].

## 2017-03-06 NOTE — Progress Notes (Signed)
Dr. Arsenio LoaderSommer notified about pt's 2050 ABG, orders given and carried out by nurse.

## 2017-03-06 NOTE — ED Notes (Signed)
Cardiology at bedside.

## 2017-03-06 NOTE — Progress Notes (Signed)
eLink Physician-Brief Progress Note Patient Name: Andres RileyJohn R Cardon Jr. DOB: May 08, 1966 MRN: [id number]   Date of Service  03/07/2017  HPI/Events of Note  ABG on 50%/PRVC 35/TV 600/P 5 = 6.966/27.4/88.5/6.1  eICU Interventions  Will order: 1. NaHCO3 200 meq IV now. 2. Increase NaHCO3 IV infusion to 175 mL/hour. 3. ABG at 12 midnight.      Intervention Category Major Interventions: Acid-Base disturbance - evaluation and management;Respiratory failure - evaluation and management  Lenell AntuSommer,Gentri Guardado Eugene 03/09/2017, 10:59 PM

## 2017-03-06 NOTE — ED Triage Notes (Signed)
Pt presents via [facility] for evaluation of sob and bradycardia. EMS reports pt called out for sob and bilateral side pain. States pt was axo x4 on arrival, states pt was bradycardic at 34 HR. Pt then had unresponsive episode requiring assisted ventilation. EMS was pacing pt at 70 bpm. Pt no longer paced on arrival to ED. Pt GCS 5. Pt given 324 ASA in route prior to unresponsive episode.

## 2017-03-06 NOTE — Procedures (Signed)
Central Venous  Dialysis Catheter Insertion Procedure Note Andres RileyJohn R Blasingame Jr. [id number] [birth year]  Procedure: Insertion of Central Venous Dialysis Catheter Indications: Dialysis  Procedure Details Consent: Risks of procedure as well as the alternatives and risks of each were explained to the (patient/caregiver).  Consent for procedure obtained. Time Out: Verified patient identification, verified procedure, site/side was marked, verified correct patient position, special equipment/implants available, medications/allergies/relevent history reviewed, required imaging and test results available.  Performed  Maximum sterile technique was used including antiseptics, cap, gloves, gown, hand hygiene, mask and sheet. Skin prep: Chlorhexidine; local anesthetic administered A antimicrobial bonded/coated triple lumen catheter was placed in the right internal jugular vein using the Seldinger technique.  Biopatch applied.   Evaluation Blood flow good Complications: No apparent complications Patient did tolerate procedure well. Chest X-ray ordered to verify placement.  CXR: pending.  Procedure performed with ultrasound guidance for real time vessel cannulation.     Andres BoyerBrooke Sanchez, AGACNP-BC [facility] Pgr: [phone] or if no answer [phone] 10-01-17, 3:37 PM  Andres ReedyWesam G. Sanchez, M.D. [facility]. Pager: [phone]. After hours pager: [phone].

## 2017-03-06 NOTE — Progress Notes (Addendum)
Patient ID: Andres RileyJohn R Martos Jr., male   DOB: [birth year], 51 y.o.   MRN: [id number]  S- levophed from 40 to 100, last abg acidotic, DNR established O- Vitals:   02/26/2017 1630 03/19/2017 1700  BP: (!) 63/18 (!) 75/21  Pulse: 71 77  Resp: (!) 32 (!) 32  Temp: 97.5 F (36.4 C) 97.5 F (36.4 C)     General: not awake,  Neuro: perr4 mm sluggish, slight open eyes HEENT: lines clean, jvd down PULM: coarse distant CV:  s1 s2 RRR distant GI: soft, hypoactive BS, no r/g, no organomegally Extremities:  Edema mild  A MODS Septic shock likely ( unknown source) Acute resp failure, r/o impending PNA, some hilar hazziness ARF, ATN likely R/o abdominal source ( exam NOT c/w, but CT not with contrast) Refractory lactic acidosis  - he is worsening with shock, cvvhd about to start -the CT abdo was neg but done without contrast, his exam is soft, but the degree of shock is concerning for bowel ischemia -need echo to r/o cardiogenic component -levophed to 125 -stat ABG -consider addition angiotensin 2 if repeat abg showing some progress and feel this is reversible? -vasopressin continued -if angiotensin added then add sub q hep regardless of coags noted as risk dvt higher  -repeat coags in am  -change cefepime ( no anaerobic) to meropenem -add empiric antifungal coverage -family at bedside, I updated them -ABG q4h -cvp noted 13, but we dont know his pa pressures, will bolus again -consider swan -bicarb for now drip -lactic repeat -repeat lipase -add fent drip, appears dyschrony -too unstable to transport back for CT with contrast  Ccm time 45 min   Mcarthur Rossettianiel J. Tyson AliasFeinstein, MD, FACP Pgr: [phone] [place] [place] [facility]

## 2017-03-06 NOTE — Progress Notes (Signed)
eLink Physician-Brief Progress Note Patient Name: Andres RileyJohn R Brouwer Jr. DOB: [birth year] MRN: [id number]   Date of Service  03/13/2017  HPI/Events of Note  ABG on 50%/PRVC 35/TV 600/P 5 = 6.82/28.4/91.1/4.4  eICU Interventions  Will order: 1. NaHCO3 200 meq IV now. 2. Increase NaHCO3 IV infusion to 150 meq/hour. 3. Repeat ABG at 10:30 PM.     Intervention Category Major Interventions: Acid-Base disturbance - evaluation and management;Respiratory failure - evaluation and management  Korinne Greenstein Eugene 02/24/2017, 9:15 PM

## 2017-03-06 NOTE — Consult Note (Signed)
Referring Provider: No ref. provider found Primary Care Physician:  Patient, No Pcp Per Primary Nephrologist:     Reason for Consultation:   Shock with multiorgan failure and acute renal failure  Hyperkalemia   HPI: 51 year old diabetic with tobacco and alcohol abuse was brought to the Er with EMS and found to be bradycardic His creatinine was 12 and K 7.2  He developed unresponsiveness and developed respiratory failure   He had some infiltrates on CT  He was also found to have some mesenteric haziness in abdomen  Lactate was 15 Trop 0.13  No hydro on renal ultrasound  He was taking ACE inhibitor as an outpatient   Past Medical History:  Diagnosis Date  . Diabetes mellitus without complication (HCC)   . Hypertension   . Liver abscess   . Tachycardia     Past Surgical History:  Procedure Laterality Date  . INCISE AND DRAIN ABCESS     liver X 2    Prior to Admission medications   Medication Sig Start Date End Date Taking? Authorizing Provider  aspirin 81 MG chewable tablet Chew 81 mg by mouth daily.   Yes [provider]  atorvastatin (LIPITOR) 40 MG tablet Take 40 mg by mouth daily.   Yes [provider]  benazepril (LOTENSIN) 5 MG tablet Take 5 mg by mouth daily.   Yes [provider]  folic acid (FOLVITE) 1 MG tablet Take 1 mg by mouth daily.   Yes [provider]  metFORMIN (GLUCOPHAGE) 1000 MG tablet Take 1,000 mg by mouth 2 (two) times daily with a meal.   Yes [provider]  metoprolol succinate (TOPROL-XL) 50 MG 24 hr tablet Take 50 mg by mouth 2 (two) times daily. Take with or immediately following a meal.   Yes [provider]  Multiple Vitamin (MULTIVITAMIN) tablet Take 1 tablet by mouth daily.   Yes [provider]  ranitidine (ZANTAC) 150 MG tablet Take 150 mg by mouth at bedtime.    Yes [provider]  traMADol (ULTRAM) 50 MG tablet Take 50 mg by mouth every 4 (four) hours as needed for  moderate pain.   Yes [provider]    Current Facility-Administered Medications  Medication Dose Route Frequency Provider Last Rate Last Dose  . 0.9 %  sodium chloride infusion  250 mL Intravenous PRN Minor, Vilinda Blanks, NP      . calcium chloride injection 1 g  1 g Intravenous Once Rolland Porter, MD      . Melene Muller ON 03/07/2017] ceFEPIme (MAXIPIME) 1 g in dextrose 5 % 50 mL IVPB  1 g Intravenous Q24H MastersDarl Householder, [facility]      . dextrose 50 % solution 50 mL  50 mL Intravenous Once Rolland Porter, MD      . fentaNYL (SUBLIMAZE) 100 MCG/2ML injection           . hydrocortisone sodium succinate (SOLU-CORTEF) 100 MG injection 100 mg  100 mg Intravenous Q8H Minor, Vilinda Blanks, NP   100 mg at 03/07/17 1149  . norepinephrine (LEVOPHED) 4 mg in dextrose 5 % 250 mL (0.016 mg/mL) infusion  0-40 mcg/min Intravenous Titrated Minor, Vilinda Blanks, NP 131.3 mL/hr at 2017-03-07 1357 35 mcg/min at 2017/03/07 1357  . pantoprazole (PROTONIX) injection 40 mg  40 mg Intravenous QHS Minor, Vilinda Blanks, NP      . propofol (DIPRIVAN) 1000 MG/100ML infusion  5-70 mcg/kg/min Intravenous Once Rolland Porter, MD   Stopped at 03-07-2017 1023  .  sodium bicarbonate 150 mEq in dextrose 5 % 1,000 mL infusion   Intravenous Continuous Minor, Vilinda Blanks, NP 125 mL/hr at 03-12-17 1008    . vasopressin (PITRESSIN) 40 Units in sodium chloride 0.9 % 250 mL (0.16 Units/mL) infusion  0.03 Units/min Intravenous Continuous Minor, Vilinda Blanks, NP 11.3 mL/hr at 2017/03/12 1014 0.03 Units/min at 12-Mar-2017 1014   Current Outpatient Prescriptions  Medication Sig Dispense Refill  . aspirin 81 MG chewable tablet Chew 81 mg by mouth daily.    Marland Kitchen atorvastatin (LIPITOR) 40 MG tablet Take 40 mg by mouth daily.    . benazepril (LOTENSIN) 5 MG tablet Take 5 mg by mouth daily.    . folic acid (FOLVITE) 1 MG tablet Take 1 mg by mouth daily.    . metFORMIN (GLUCOPHAGE) 1000 MG tablet Take 1,000 mg by mouth 2 (two) times daily with a meal.    . metoprolol succinate  (TOPROL-XL) 50 MG 24 hr tablet Take 50 mg by mouth 2 (two) times daily. Take with or immediately following a meal.    . Multiple Vitamin (MULTIVITAMIN) tablet Take 1 tablet by mouth daily.    . ranitidine (ZANTAC) 150 MG tablet Take 150 mg by mouth at bedtime.     . traMADol (ULTRAM) 50 MG tablet Take 50 mg by mouth every 4 (four) hours as needed for moderate pain.      Allergies as of Mar 12, 2017  . (No Known Allergies)    Family History  Problem Relation Age of Onset  . Hypertension Mother   . Healthy Sister     Social History   Social History  . Marital status: Married    Spouse name: N/A  . Number of children: N/A  . Years of education: N/A   Occupational History  . Not on file.   Social History Main Topics  . Smoking status: Current Every Day Smoker    Packs/day: 1.00  . Smokeless tobacco: Never Used  . Alcohol use Yes     Comment:  pint daily or greater  . Drug use: No  . Sexual activity: Yes   Other Topics Concern  . Not on file   Social History Narrative  . No narrative on file    Review of Systems: unobtainable  Physical Exam: Vital signs in last 24 hours: Temp:  [90 F (32.2 C)-95.2 F (35.1 C)] 95.2 F (35.1 C) (05/17 1400) Pulse Rate:  [61-77] 75 (05/17 1400) Resp:  [17-32] 32 (05/17 1400) BP: (63-152)/(23-115) 77/28 (05/17 1400) SpO2:  [95 %-100 %] 96 % (05/17 1400) Arterial Line BP: (59-97)/(27-40) 82/37 (05/17 1400) FiO2 (%):  [50 %-100 %] 50 % (05/17 1221) Weight:  [264 lb 8.8 oz (120 kg)] 264 lb 8.8 oz (120 kg) (05/17 0839)   General:    Unresponsive  Head:  Normocephalic and atraumatic. Eyes:  Sclera clear, no icterus.   Conjunctiva pink. Ears:  Normal auditory acuity. Nose:  No deformity, discharge,  or lesions. Mouth:  No deformity or lesions, dentition normal. Neck:  Supple; no masses or thyromegaly. JVP not elevated Lungs:  Patient intubated Heart:  Regular rate and rhythm; no murmurs, clicks, rubs,  or gallops. Abdomen:  Soft,  nontender and nondistended. No masses, hepatosplenomegaly or hernias noted. Normal bowel sounds, without guarding, and without rebound.   Msk:  Symmetrical without gross deformities. Normal posture. Pulses:  No carotid, renal, femoral bruits. DP and PT symmetrical and equal Extremities:  Without clubbing or edema. Neurologic:  Alert and  oriented x4;  grossly normal neurologically. Skin:  Intact without significant lesions or rashes. Cervical Nodes:  No significant cervical adenopathy. Psych Unresponsive   Intake/Output from previous day: No intake/output data recorded. Intake/Output this shift: No intake/output data recorded.  Lab Results:  Recent Labs  2017/04/20 0754 2017/04/20 0859 2017/04/20 1136  WBC  --  5.6 8.1  HGB 15.6 9.2* 10.1*  HCT 46.0 31.1* 32.9*  PLT  --  89* 85*   BMET  Recent Labs  2017/04/20 0754 2017/04/20 0859 2017/04/20 1136  NA 132* 136 138  K 7.0* >7.5* >7.5*  CL 106 97* 99*  CO2  --  <7* <7*  GLUCOSE 71 115* 155*  BUN 86* 75* 74*  CREATININE 13.50* 12.40* 11.88*  CALCIUM  --  7.5* 7.3*  PHOS  --   --  14.7*   LFT  Recent Labs  2017/04/20 1136  PROT 5.1*  ALBUMIN 2.6*  AST 137*  ALT 58  ALKPHOS 109  BILITOT 1.7*   PT/INR  Recent Labs  2017/04/20 1136  LABPROT 25.6*  INR 2.29   Hepatitis Panel No results for input(s): HEPBSAG, HCVAB, HEPAIGM, HEPBIGM in the last 72 hours.  Studies/Results: Ct Abdomen Pelvis Wo Contrast  Result Date: 18-Dec-2016 CLINICAL DATA:  Hypoxemia. Pt intubated. Unable to obtain hx from pt. Per RN pt was complaining of pain both sides. Pt has brown substance coming from OG tube. Pt has 15 lactic acid and 13.50 creatinine. Pt not on dialysis. EXAM: CT CHEST, ABDOMEN AND PELVIS WITHOUT CONTRAST TECHNIQUE: Multidetector CT imaging of the chest, abdomen and pelvis was performed following the standard protocol without IV contrast. COMPARISON:  None. FINDINGS: CT CHEST FINDINGS Cardiovascular: No significant vascular  findings. Normal heart size. No pericardial effusion. Coronary artery atherosclerosis in the LAD. Mediastinum/Nodes: No enlarged mediastinal, hilar, or axillary lymph nodes. Thyroid gland, trachea, and esophagus demonstrate no significant findings. Lungs/Pleura: Small bilateral pleural effusions. Bilateral lower lobe airspace disease which may reflect atelectasis versus pneumonia. Musculoskeletal: No acute osseous abnormality. No aggressive lytic or sclerotic osseous lesion. Anterior bridging thoracic spine osteophytes as can be seen with diffuse idiopathic skeletal hyperostosis. CT ABDOMEN PELVIS FINDINGS Hepatobiliary: Low attenuation of the liver as can be seen with hepatic steatosis. Normal gallbladder. Pancreas: Unremarkable. No pancreatic ductal dilatation or surrounding inflammatory changes. Spleen: Normal in size without focal abnormality. Adrenals/Urinary Tract: Normal adrenal glands. Normal kidneys. Mild nonspecific bilateral perinephric stranding. No urolithiasis or obstructive uropathy. Decompressed bladder. Stomach/Bowel: No bowel dilatation or bowel wall thickening. Sigmoid colon and descending colon diverticulosis without evidence of diverticulitis. No pneumatosis, pneumoperitoneum or portal venous gas. Nasogastric tube in the stomach. Mild fat stranding in the mesenteries. Vascular/Lymphatic: Normal caliber abdominal aorta. No lymphadenopathy. Reproductive: Prostate is unremarkable. Other: No abdominal wall hernia or abnormality. No abdominopelvic ascites. Musculoskeletal: No acute osseous abnormality. No lytic or sclerotic osseous lesion. Severe degenerative disc disease with disc height loss at L5-S1. Broad-based disc bulges at L4-5 and L5-S1. IMPRESSION: 1. Bilateral small pleural effusions. Bilateral lower lobe airspace disease which may reflect atelectasis versus pneumonia. 2. Diverticulosis without evidence of diverticulitis. 3. Hepatic steatosis. 4. Mild mesenteric haziness which may be  secondary to mild edema versus mild mesenteritis. Electronically Signed   By: Elige KoHetal  Patel   On: 028-Feb-2018 08:49   Ct Chest Wo Contrast  Result Date: 18-Dec-2016 CLINICAL DATA:  Hypoxemia. Pt intubated. Unable to obtain hx from pt. Per RN pt was complaining of pain both sides. Pt has brown substance coming from OG tube. Pt has 15 lactic  acid and 13.50 creatinine. Pt not on dialysis. EXAM: CT CHEST, ABDOMEN AND PELVIS WITHOUT CONTRAST TECHNIQUE: Multidetector CT imaging of the chest, abdomen and pelvis was performed following the standard protocol without IV contrast. COMPARISON:  None. FINDINGS: CT CHEST FINDINGS Cardiovascular: No significant vascular findings. Normal heart size. No pericardial effusion. Coronary artery atherosclerosis in the LAD. Mediastinum/Nodes: No enlarged mediastinal, hilar, or axillary lymph nodes. Thyroid gland, trachea, and esophagus demonstrate no significant findings. Lungs/Pleura: Small bilateral pleural effusions. Bilateral lower lobe airspace disease which may reflect atelectasis versus pneumonia. Musculoskeletal: No acute osseous abnormality. No aggressive lytic or sclerotic osseous lesion. Anterior bridging thoracic spine osteophytes as can be seen with diffuse idiopathic skeletal hyperostosis. CT ABDOMEN PELVIS FINDINGS Hepatobiliary: Low attenuation of the liver as can be seen with hepatic steatosis. Normal gallbladder. Pancreas: Unremarkable. No pancreatic ductal dilatation or surrounding inflammatory changes. Spleen: Normal in size without focal abnormality. Adrenals/Urinary Tract: Normal adrenal glands. Normal kidneys. Mild nonspecific bilateral perinephric stranding. No urolithiasis or obstructive uropathy. Decompressed bladder. Stomach/Bowel: No bowel dilatation or bowel wall thickening. Sigmoid colon and descending colon diverticulosis without evidence of diverticulitis. No pneumatosis, pneumoperitoneum or portal venous gas. Nasogastric tube in the stomach. Mild fat  stranding in the mesenteries. Vascular/Lymphatic: Normal caliber abdominal aorta. No lymphadenopathy. Reproductive: Prostate is unremarkable. Other: No abdominal wall hernia or abnormality. No abdominopelvic ascites. Musculoskeletal: No acute osseous abnormality. No lytic or sclerotic osseous lesion. Severe degenerative disc disease with disc height loss at L5-S1. Broad-based disc bulges at L4-5 and L5-S1. IMPRESSION: 1. Bilateral small pleural effusions. Bilateral lower lobe airspace disease which may reflect atelectasis versus pneumonia. 2. Diverticulosis without evidence of diverticulitis. 3. Hepatic steatosis. 4. Mild mesenteric haziness which may be secondary to mild edema versus mild mesenteritis. Electronically Signed   By: Elige Ko   On: 02/23/2017 08:49   US Renal  Result Date: 03/14/2017 CLINICAL DATA:  Elevated creatinine EXAM: RENAL / URINARY TRACT ULTRASOUND COMPLETE COMPARISON:  None. FINDINGS: Right Kidney: Length: 14.1 cm. Echogenicity within normal limits. No mass or hydronephrosis visualized. Left Kidney: Length: 14.4 cm. Echogenicity within normal limits. No mass or hydronephrosis visualized. Bladder: Decompressed by Foley catheter. IMPRESSION: No acute abnormality noted. Electronically Signed   By: Alcide Clever M.D.   On: 03/16/2017 12:32   Dg Chest Portable 1 View  Result Date: 02/28/2017 CLINICAL DATA:  Status post central line placement EXAM: PORTABLE CHEST 1 VIEW COMPARISON:  02/28/2017 FINDINGS: Endotracheal tube, nasogastric catheter and left jugular central line are not seen. The central line tip is noted in the proximal superior vena cava. No pneumothorax is seen. Stable changes are noted in the right mid lung. No new focal abnormality is seen. IMPRESSION: Status post central line placement as described. The remainder of the study is stable from the previous exam. Electronically Signed   By: Alcide Clever M.D.   On: 02/28/2017 10:18   Dg Chest Portable 1 View  Result Date:  03/02/2017 CLINICAL DATA:  Hypoxia EXAM: PORTABLE CHEST 1 VIEW COMPARISON:  December 31, 2016 FINDINGS: Endotracheal tube tip is 4.0 cm above the carina. Nasogastric tube tip and side port are in stomach. No pneumothorax. There is new atelectatic change in the mid lung. Lungs elsewhere are clear. Heart size and pulmonary vascularity are within normal limits. No adenopathy. No bone lesions. IMPRESSION: Tube positions as described without pneumothorax. Right midlung atelectasis. Lungs elsewhere clear. Heart size within normal limits. Electronically Signed   By: Bretta Bang III M.D.   On: 02/28/2017 08:05  Assessment/Plan: 1. Acute oliguric renal failure   - no hydronephrosis on Ultrasoun Etiology most likely shock and ATN  Discussed with Dr Molli Knock and will proceed with CVVHDF  2. Shock  Pressors per CCM  Cover with broad spectrum antibiotics  3. Hyperkalemia  No added K  4. Metabolic acidosis  Bicarb per CCM   LOS: 0 Caydyn Sprung W @TODAY @2 :37 PM

## 2017-03-06 NOTE — ED Provider Notes (Signed)
[facility]-EMERGENCY DEPT Provider Note   CSN: [id number] Arrival date & time: 03/03/2017  0865     History   Chief Complaint Chief Complaint  Patient presents with  . Bradycardia  . Shortness of Breath    HPI Andres Sanchez. is a 51 y.o. male. Chief complaint is abdominal pain, unresponsive  HPI: 51 year old male history of prior MI and PTCA, non-insulin diabetes, liver abscess, alcoholism. He presents via EMS minimally responsive, undergoing bag assisted ventilations.  Ultimately is able to speak with wife. She states that he is alcoholic. He has been sick for 2 days. Yesterday was having vomiting. Uncertain about diarrhea.  He was still drinking alcohol. Wife states she attempted to get him in sugar, and Powerade. She is uncertain about the quantity of emesis. No description of hematemesis. This morning she awakened after sleeping on the couch and went to find him in the bedroom "breathing hard and fast". She states his heart rate was fast. She gave him his metoprolol. He began to complain of sudden worsening bilateral upper abdominal pain. Wife offered to drive him to [facility] where she works. He asked for her to call 911.  Hospital course: X-ray of the find the patient awake. Holding his abdomen. He took several steps towards him and had near syncope. They were able to help him onto the cot. An ambulance was noted to be bradycardic in the 30s. Undetermined rhythm. Became less responsive en route. Had sugar of 80. Was undergoing bag assisted ventilations upon arrival. Twelve-lead EKG prehospital did not show ST elevations. Rhythm is bradycardiac. A. fib versus third-degree heart block. No obvious P waves noted. Marked artifact  Past Medical History:  Diagnosis Date  . Diabetes mellitus without complication (HCC)   . Hypertension     Patient Active Problem List   Diagnosis Date Noted  . Sepsis (HCC) 03/16/2017    History reviewed. No pertinent surgical  history.     Home Medications    Prior to Admission medications   Medication Sig Start Date End Date Taking? Authorizing Provider  acamprosate (CAMPRAL) 333 MG tablet Take 333 mg by mouth 3 (three) times daily with meals.   Yes [provider]  aspirin 81 MG chewable tablet Chew 81 mg by mouth daily.   Yes [provider]  atorvastatin (LIPITOR) 40 MG tablet Take 40 mg by mouth daily.   Yes [provider]  benazepril (LOTENSIN) 5 MG tablet Take 5 mg by mouth daily.   Yes [provider]  buPROPion (ZYBAN) 150 MG 12 hr tablet Take 150 mg by mouth 2 (two) times daily.   Yes [provider]  folic acid (FOLVITE) 1 MG tablet Take 1 mg by mouth daily.   Yes [provider]  metFORMIN (GLUCOPHAGE) 1000 MG tablet Take 1,000 mg by mouth 2 (two) times daily with a meal.   Yes [provider]  metoprolol (TOPROL-XL) 200 MG 24 hr tablet Take by mouth daily.   Yes [provider]  Multiple Vitamin (MULTIVITAMIN) tablet Take 1 tablet by mouth daily.   Yes [provider]  ranitidine (ZANTAC) 150 MG tablet Take 150 mg by mouth daily.   Yes [provider]  traMADol (ULTRAM) 50 MG tablet Take 50 mg by mouth every 4 (four) hours as needed for moderate pain.   Yes [provider]    Family History No family history on file.  Social History Social History  Substance Use Topics  . Smoking status:  Current Every Day Smoker  . Smokeless tobacco: Not on file  . Alcohol use Yes     Allergies   Patient has no allergy information on record.   Review of Systems Review of Systems  Unable to perform ROS: Mental status change     Physical Exam Updated Vital Signs BP (!) 70/27   Pulse 74   Temp (!) 92.1 F (33.4 C)   Resp (!) 32   Ht 5\' 11"  (1.803 m)   Wt 264 lb 8.8 oz (120 kg)   SpO2 98%   BMI 36.90 kg/m   Physical Exam  Constitutional:  Morbidly obese large stature 51 year old male  unresponsive.  HENT:  No trauma. Conjunctiva not pale.  Eyes:  3 mm reactive bilateral. Conjunctiva not pale.  Neck:  No JVD.  Cardiovascular:  bradycardic rhythm. He does perfuse each of these. Rate 34-40.  Pulmonary/Chest:  Globally diminished breath sound. No crackles or rales.  Abdominal:  Soft abdomen. No pulsatile mass. Skin of the torso and abdomen is mottled.  Genitourinary:  Genitourinary Comments: Guaiac negative.  Musculoskeletal:  Will withdraw all 4 extremities to noxious stimuli.  Neurological:  Nonverbal. Will open eyes to voice. GCS 8.  Skin:  Mottled. Cool     ED Treatments / Results  Labs (all labs ordered are listed, but only abnormal results are displayed) Labs Reviewed  CBC WITH DIFFERENTIAL/PLATELET - Abnormal; Notable for the following:       Result Value   RBC 2.52 (*)    Hemoglobin 9.2 (*)    HCT 31.1 (*)    MCV 123.4 (*)    MCH 36.5 (*)    MCHC 29.6 (*)    Platelets 89 (*)    All other components within normal limits  COMPREHENSIVE METABOLIC PANEL - Abnormal; Notable for the following:    Potassium >7.5 (*)    Chloride 97 (*)    CO2 <7 (*)    Glucose, Bld 115 (*)    BUN 75 (*)    Creatinine, Ser 12.40 (*)    Calcium 7.5 (*)    Total Protein 5.0 (*)    Albumin 2.5 (*)    AST 103 (*)    Total Bilirubin 1.3 (*)    GFR calc non Af Amer 4 (*)    GFR calc Af Amer 5 (*)    All other components within normal limits  ETHANOL - Abnormal; Notable for the following:    Alcohol, Ethyl (B) 186 (*)    All other components within normal limits  I-STAT TROPOININ, ED - Abnormal; Notable for the following:    Troponin i, poc 0.13 (*)    All other components within normal limits  I-STAT CG4 LACTIC ACID, ED - Abnormal; Notable for the following:    Lactic Acid, Venous 15.01 (*)    All other components within normal limits  I-STAT CHEM 8, ED - Abnormal; Notable for the following:    Sodium 132 (*)    Potassium 7.0 (*)    BUN 86 (*)    Creatinine,  Ser 13.50 (*)    Calcium, Ion 0.82 (*)    All other components within normal limits  CBG MONITORING, ED - Abnormal; Notable for the following:    Glucose-Capillary 62 (*)    All other components within normal limits  I-STAT ARTERIAL BLOOD GAS, ED - Abnormal; Notable for the following:    pH, Arterial 6.728 (*)    pO2, Arterial 363.0 (*)    Bicarbonate  5.5 (*)    Acid-base deficit 29.0 (*)    All other components within normal limits  CULTURE, BLOOD (ROUTINE X 2)  CULTURE, BLOOD (ROUTINE X 2)  CULTURE, BLOOD (ROUTINE X 2)  CULTURE, BLOOD (ROUTINE X 2)  URINE CULTURE  CULTURE, BLOOD (ROUTINE X 2)  CULTURE, BLOOD (ROUTINE X 2)  URINE CULTURE  CULTURE, RESPIRATORY (NON-EXPECTORATED)  BLOOD GAS, ARTERIAL  RAPID URINE DRUG SCREEN, HOSP PERFORMED  URINALYSIS, ROUTINE W REFLEX MICROSCOPIC  LACTIC ACID, PLASMA  CORTISOL  CBC  COMPREHENSIVE METABOLIC PANEL  LACTIC ACID, PLASMA  LACTIC ACID, PLASMA  URINALYSIS, ROUTINE W REFLEX MICROSCOPIC  CORTISOL  TROPONIN I  PROTIME-INR  PROCALCITONIN  APTT  URINALYSIS, COMPLETE (UACMP) WITH MICROSCOPIC  HIV ANTIBODY (ROUTINE TESTING)  MAGNESIUM  PHOSPHORUS  CORTISOL  STREP PNEUMONIAE URINARY ANTIGEN  BLOOD GAS, ARTERIAL  LIPASE, BLOOD  AMYLASE  I-STAT CG4 LACTIC ACID, ED  I-STAT CG4 LACTIC ACID, ED  TYPE AND SCREEN    EKG  EKG Interpretation None       Radiology Ct Abdomen Pelvis Wo Contrast  Result Date: Mar 28, 2017 CLINICAL DATA:  Hypoxemia. Pt intubated. Unable to obtain hx from pt. Per RN pt was complaining of pain both sides. Pt has brown substance coming from OG tube. Pt has 15 lactic acid and 13.50 creatinine. Pt not on dialysis. EXAM: CT CHEST, ABDOMEN AND PELVIS WITHOUT CONTRAST TECHNIQUE: Multidetector CT imaging of the chest, abdomen and pelvis was performed following the standard protocol without IV contrast. COMPARISON:  None. FINDINGS: CT CHEST FINDINGS Cardiovascular: No significant vascular findings. Normal heart  size. No pericardial effusion. Coronary artery atherosclerosis in the LAD. Mediastinum/Nodes: No enlarged mediastinal, hilar, or axillary lymph nodes. Thyroid gland, trachea, and esophagus demonstrate no significant findings. Lungs/Pleura: Small bilateral pleural effusions. Bilateral lower lobe airspace disease which may reflect atelectasis versus pneumonia. Musculoskeletal: No acute osseous abnormality. No aggressive lytic or sclerotic osseous lesion. Anterior bridging thoracic spine osteophytes as can be seen with diffuse idiopathic skeletal hyperostosis. CT ABDOMEN PELVIS FINDINGS Hepatobiliary: Low attenuation of the liver as can be seen with hepatic steatosis. Normal gallbladder. Pancreas: Unremarkable. No pancreatic ductal dilatation or surrounding inflammatory changes. Spleen: Normal in size without focal abnormality. Adrenals/Urinary Tract: Normal adrenal glands. Normal kidneys. Mild nonspecific bilateral perinephric stranding. No urolithiasis or obstructive uropathy. Decompressed bladder. Stomach/Bowel: No bowel dilatation or bowel wall thickening. Sigmoid colon and descending colon diverticulosis without evidence of diverticulitis. No pneumatosis, pneumoperitoneum or portal venous gas. Nasogastric tube in the stomach. Mild fat stranding in the mesenteries. Vascular/Lymphatic: Normal caliber abdominal aorta. No lymphadenopathy. Reproductive: Prostate is unremarkable. Other: No abdominal wall hernia or abnormality. No abdominopelvic ascites. Musculoskeletal: No acute osseous abnormality. No lytic or sclerotic osseous lesion. Severe degenerative disc disease with disc height loss at L5-S1. Broad-based disc bulges at L4-5 and L5-S1. IMPRESSION: 1. Bilateral small pleural effusions. Bilateral lower lobe airspace disease which may reflect atelectasis versus pneumonia. 2. Diverticulosis without evidence of diverticulitis. 3. Hepatic steatosis. 4. Mild mesenteric haziness which may be secondary to mild edema  versus mild mesenteritis. Electronically Signed   By: Elige Ko   On: March 28, 2017 08:49   Ct Chest Wo Contrast  Result Date: 03/28/17 CLINICAL DATA:  Hypoxemia. Pt intubated. Unable to obtain hx from pt. Per RN pt was complaining of pain both sides. Pt has brown substance coming from OG tube. Pt has 15 lactic acid and 13.50 creatinine. Pt not on dialysis. EXAM: CT CHEST, ABDOMEN AND PELVIS WITHOUT CONTRAST TECHNIQUE: Multidetector CT imaging of  the chest, abdomen and pelvis was performed following the standard protocol without IV contrast. COMPARISON:  None. FINDINGS: CT CHEST FINDINGS Cardiovascular: No significant vascular findings. Normal heart size. No pericardial effusion. Coronary artery atherosclerosis in the LAD. Mediastinum/Nodes: No enlarged mediastinal, hilar, or axillary lymph nodes. Thyroid gland, trachea, and esophagus demonstrate no significant findings. Lungs/Pleura: Small bilateral pleural effusions. Bilateral lower lobe airspace disease which may reflect atelectasis versus pneumonia. Musculoskeletal: No acute osseous abnormality. No aggressive lytic or sclerotic osseous lesion. Anterior bridging thoracic spine osteophytes as can be seen with diffuse idiopathic skeletal hyperostosis. CT ABDOMEN PELVIS FINDINGS Hepatobiliary: Low attenuation of the liver as can be seen with hepatic steatosis. Normal gallbladder. Pancreas: Unremarkable. No pancreatic ductal dilatation or surrounding inflammatory changes. Spleen: Normal in size without focal abnormality. Adrenals/Urinary Tract: Normal adrenal glands. Normal kidneys. Mild nonspecific bilateral perinephric stranding. No urolithiasis or obstructive uropathy. Decompressed bladder. Stomach/Bowel: No bowel dilatation or bowel wall thickening. Sigmoid colon and descending colon diverticulosis without evidence of diverticulitis. No pneumatosis, pneumoperitoneum or portal venous gas. Nasogastric tube in the stomach. Mild fat stranding in the  mesenteries. Vascular/Lymphatic: Normal caliber abdominal aorta. No lymphadenopathy. Reproductive: Prostate is unremarkable. Other: No abdominal wall hernia or abnormality. No abdominopelvic ascites. Musculoskeletal: No acute osseous abnormality. No lytic or sclerotic osseous lesion. Severe degenerative disc disease with disc height loss at L5-S1. Broad-based disc bulges at L4-5 and L5-S1. IMPRESSION: 1. Bilateral small pleural effusions. Bilateral lower lobe airspace disease which may reflect atelectasis versus pneumonia. 2. Diverticulosis without evidence of diverticulitis. 3. Hepatic steatosis. 4. Mild mesenteric haziness which may be secondary to mild edema versus mild mesenteritis. Electronically Signed   By: Elige Ko   On: 04/05/17 08:49   Dg Chest Portable 1 View  Result Date: 04-05-17 CLINICAL DATA:  Status post central line placement EXAM: PORTABLE CHEST 1 VIEW COMPARISON:  2017/04/05 FINDINGS: Endotracheal tube, nasogastric catheter and left jugular central line are not seen. The central line tip is noted in the proximal superior vena cava. No pneumothorax is seen. Stable changes are noted in the right mid lung. No new focal abnormality is seen. IMPRESSION: Status post central line placement as described. The remainder of the study is stable from the previous exam. Electronically Signed   By: Alcide Clever M.D.   On: 2017/04/05 10:18   Dg Chest Portable 1 View  Result Date: 2017/04/05 CLINICAL DATA:  Hypoxia EXAM: PORTABLE CHEST 1 VIEW COMPARISON:  December 31, 2016 FINDINGS: Endotracheal tube tip is 4.0 cm above the carina. Nasogastric tube tip and side port are in stomach. No pneumothorax. There is new atelectatic change in the mid lung. Lungs elsewhere are clear. Heart size and pulmonary vascularity are within normal limits. No adenopathy. No bone lesions. IMPRESSION: Tube positions as described without pneumothorax. Right midlung atelectasis. Lungs elsewhere clear. Heart size within normal  limits. Electronically Signed   By: Bretta Bang III M.D.   On: 2017/04/05 08:05    Procedures Procedures (including critical care time)  Medications Ordered in ED Medications  calcium chloride injection 1 g (1 g Intravenous Not Given 04-05-2017 1023)  dextrose 50 % solution 50 mL (50 mLs Intravenous Not Given 2017/04/05 1023)  sodium chloride 0.9 % bolus 1,000 mL (0 mLs Intravenous Stopped Apr 05, 2017 0851)    And  sodium chloride 0.9 % bolus 1,000 mL (0 mLs Intravenous Stopped 05-Apr-2017 0939)    And  sodium chloride 0.9 % bolus 1,000 mL (0 mLs Intravenous Stopped 05-Apr-2017 1024)    And  sodium chloride 0.9 % bolus 1,000 mL (1,000 mLs Intravenous New Bag/Given 04/04/17 1025)  thiamine (B-1) injection 100 mg (not administered)  propofol (DIPRIVAN) 1000 MG/100ML infusion (0 mcg/kg/min  120 kg Intravenous Hold 2017/04/04 1023)  vancomycin (VANCOCIN) 2,500 mg in sodium chloride 0.9 % 500 mL IVPB (2,500 mg Intravenous New Bag/Given 04-Apr-2017 0921)  sodium bicarbonate 150 mEq in dextrose 5 % 1,000 mL infusion ( Intravenous New Bag/Given 04-Apr-2017 1008)  norepinephrine (LEVOPHED) 4 mg in dextrose 5 % 250 mL (0.016 mg/mL) infusion (21 mcg/min Intravenous Rate/Dose Change 04-04-17 1042)  vasopressin (PITRESSIN) 40 Units in sodium chloride 0.9 % 250 mL (0.16 Units/mL) infusion (0.03 Units/min Intravenous New Bag/Given Apr 04, 2017 1014)  fentaNYL (SUBLIMAZE) 100 MCG/2ML injection (not administered)  fentaNYL (SUBLIMAZE) injection (100 mcg Intravenous Given 2017-04-04 0945)  rocuronium (ZEMURON) injection (50 mg Intravenous Given 04-04-2017 0950)  hydrocortisone sodium succinate (SOLU-CORTEF) 100 MG injection 100 mg (not administered)  pantoprazole (PROTONIX) injection 40 mg (not administered)  0.9 %  sodium chloride infusion (not administered)  naloxone (NARCAN) injection ( Intravenous Canceled Entry 2017-04-04 0745)  etomidate (AMIDATE) injection (20 mg Intravenous Given 04-04-2017 0741)  succinylcholine (ANECTINE) injection  (200 mg Intravenous Given Apr 04, 2017 0741)  sodium chloride 0.9 % bolus 1,000 mL (0 mLs Intravenous Stopped 04-Apr-2017 0839)  0.9 %  sodium chloride infusion ( Intravenous New Bag/Given 04-04-2017 0800)  sodium bicarbonate injection 50 mEq (50 mEq Intravenous Given 04-Apr-2017 0910)  sodium bicarbonate injection (50 mEq Intravenous Given 04-04-17 0803)  calcium chloride injection (1 g Intravenous Given 2017-04-04 0804)  dextrose 50 % solution (1 ampule Intravenous Given 04/04/17 0803)  ceFEPIme (MAXIPIME) 2 g in dextrose 5 % 50 mL IVPB (0 g Intravenous Stopped 04-04-17 0951)  sodium bicarbonate injection 50 mEq (50 mEq Intravenous Given 04-04-2017 0910)     Initial Impression / Assessment and Plan / ED Course  I have reviewed the triage vital signs and the nursing notes.  Pertinent labs & imaging results that were available during my care of the patient were reviewed by me and considered in my medical decision making (see chart for details).   arrival patient is undergoing axis ventilations. This was continued. Paramedics had attempted pacing without capture. Was given atropine 1 mg IV and has improvement of his heart rate changes to a sinus rhythm and rate increases into the 60s. He remains minimally to nonresponsive. He was intubated by myself. Please see procedure below. INTUBATION Performed by: Claudean Kinds  Required items: required blood products, implants, devices, and special equipment available Patient identity confirmed: provided demographic data and hospital-assigned identification number Time out: Immediately prior to procedure a "time out" was called to verify the correct patient, procedure, equipment, support staff and site/side marked as required.  Indications: unresponsive  Intubation method: +Glidescope Laryngoscopy   Preoxygenation: +BVM  Sedatives: + 20mg  Etomidate Paralytic: +200mg Succinylcholine  Tube Size: *8.0cuffed  Post-procedure assessment: chest rise and ETCO2  monitor Breath sounds: equal and absent over the epigastrium Tube secured with: ETT holder Chest x-ray interpreted by radiologist and me.  Chest x-ray findings: +endotracheal tube in appropriate position  Patient tolerated the procedure well with no immediate complications.  Itching given sepsis fluids at 30 mL/kg. Estimated weight 220 kg. Code sepsis initiated. Labs show acute renal failure. Anemia. Marked lactic acidosis. Given vancomycin and Maxipime for sepsis. CT scan shows haziness in the mesentery and bilateral inferior lung infiltrates.  Potassium noted to be 7. Given calcium chloride one amp. Bicarbonate 1 amp. Ultimately given 2 additional  amp of bicarbonate for Hadja Harral acidosis. Foley catheter placed. No urine production as yet. Critical care consultation. They're here placing central line. Bicarbonate drip has been initiated.  CRITICAL CARE Performed by: Rolland Porter JOSEPH   Total critical care time: 95 minutes  Critical care time was exclusive of separately billable procedures and treating other patients.  Critical care was necessary to treat or prevent imminent or life-threatening deterioration.  Critical care was time spent personally by me on the following activities: development of treatment plan with patient and/or surrogate as well as nursing, discussions with consultants, evaluation of patient's response to treatment, examination of patient, obtaining history from patient or surrogate, ordering and performing treatments and interventions, ordering and review of laboratory studies, ordering and review of radiographic studies, pulse oximetry and re-evaluation of patient's condition.  Final Clinical Impressions(s) / ED Diagnoses   Final diagnoses:  Hypoxemia  Metabolic acidosis  Sepsis, due to unspecified organism [facility])  Septic shock (HCC)  Acute renal failure, unspecified acute renal failure type [facility])    New Prescriptions New Prescriptions   No medications on file      Rolland Porter, MD 02/18/2017 1046

## 2017-03-06 NOTE — Progress Notes (Signed)
CRITICAL VALUE ALERT  Critical value received:  K >7.5  Date of notification: 03/01/2017  Time of notification:  1830  Critical value read back:Yes.    Nurse who received alert:  Lorin PicketLindsey Lamisha Roussell  MD notified (1st page):  Joneen RoachPaul Hoffman  Time of first page:  1830  MD notified (2nd page):  Time of second page:  Responding MD:  Mikey BussingHoffman  Time MD responded:1830

## 2017-03-06 NOTE — Procedures (Signed)
Arterial Catheter Insertion Procedure Note Andres RileyJohn R Millette Jr. [id number] [birth year]  Procedure: Insertion of Arterial Catheter  Indications: Blood pressure monitoring and Frequent blood sampling  Procedure Details Consent: Unable to obtain consent because of emergent medical necessity. Time Out: Verified patient identification, verified procedure, site/side was marked, verified correct patient position, special equipment/implants available, medications/allergies/relevent history reviewed, required imaging and test results available.  Performed  Maximum sterile technique was used including antiseptics, cap, gloves, gown, hand hygiene, mask and sheet. Skin prep: Chlorhexidine; local anesthetic administered 20 gauge catheter was inserted into right femoral artery using the Seldinger technique.  Evaluation Blood flow good; BP tracing good. Complications: No apparent complications.   Andres Sanchez PCCM Pager [phone] till 3 pm If no answer page [phone] 03/03/2017, 10:18 AM  Andres Sanchez, M.D. [facility]. Pager: [phone]. After hours pager: [phone].

## 2017-03-06 NOTE — Consult Note (Signed)
Cardiology Consultation:   Patient ID: Andres Sanchez.; [id number]; 1966-03-13   Admit date: 07-Mar-2017 Date of Consult: Mar 07, 2017  Primary Care Provider: Patient, No Pcp Per Primary Cardiologist: [facility] Primary Electrophysiologist:  n/a   Patient Profile:   Andres Sanchez. is a 51 y.o. male with a hx of HTN, DM hx tobacco and ETOH who is being seen today for the evaluation of bradycardia and shock. Hyperkalemia and acute renal failure at the request of Dr. Molli Knock with CCM.  History of Present Illness:   Andres Sanchez. 50 YOM admitted today per EMS with SOB and bradycardia, he had bilateral side pain.  When they arrived HR was 34, and he was alert and oriented.  He then became unresponsive and required ventilation.  He had external pacer placed at 70. He was given 324 of ASA prior to becoming unresponsive.     In ER he was hypothermic to 31F brady at 58, hypotensive and intubated.  Cr. Is 12 and K+ 7.2 PH 6.7.  He was placed on Bicarb drip, abx and vasopressors.   Lactic acid 15.  INR 2.29.  Troponin 0.13 now 0.09   EKG #1 Brady at 48, may be junctional regular.  Some ST depression V3-V6 EKG # 2 SR with intraventricular conduction delay  Both EKGs  personally reviewed  CT of chest and ABD normal heart size, + coronary atherosclerosis in LAD. sm pl effusions bil, lilateral lower lobe airspace disease may be atelectasis vs. PNA, diverticulosis, hepatic steatosis, mild mesenteric haziness may be secondary to mild edema vs. Mild mesenteritis.   CXR central line placed no pneumo   Renal U/s without acute abnormality.   Hx of DM, HTN and at one point in 2015 troponin leak and stress test negative per his wife. No cardiac cath.   Also recent admit at [facility] for tachycardia and neg MI.   I cannot pull up records.   + tobacco 1 PPD and at least 1 pint per day of ETOH.    Currently unresponsive on Vent on pressors and bicarb drip and BP 70 systolic. Family in  room and answered questions.  SR in 70s  BP 78 systolic.   Past Medical History:  Diagnosis Date  . Diabetes mellitus without complication (HCC)   . Hypertension   . Liver abscess   . Tachycardia     Past Surgical History:  Procedure Laterality Date  . INCISE AND DRAIN ABCESS     liver X 2     OUTPATIENT MEDICATIONS: Glucophage 1000 mg BID Folic acid 1 mg po daily Ranitidine 150 mg daily Ultram 50 mg prn pain Metoprolol succinate ER 200 mg tab-- 50 mg daily Centrum dailly Benazepril 5 mg daily acamprosate ca+ 333 mg daily Asa 81 mg daily Atorvastatin 40 mg daily     Inpatient Medications: Scheduled Meds: . calcium chloride  1 g Intravenous Once  . dextrose  50 mL Intravenous Once  . fentaNYL      . hydrocortisone sod succinate (SOLU-CORTEF) inj  100 mg Intravenous Q8H  . pantoprazole (PROTONIX) IV  40 mg Intravenous QHS   Continuous Infusions: . sodium chloride    . [START ON 03/07/2017] ceFEPime (MAXIPIME) IV    . norepinephrine (LEVOPHED) Adult infusion 35 mcg/min (2017/03/07 1357)  . propofol (DIPRIVAN) infusion Stopped (03/07/17 1023)  .  sodium bicarbonate  infusion 1000 mL 125 mL/hr at 03/07/2017 1008  . vasopressin (PITRESSIN) infusion - *FOR SHOCK* 0.03 Units/min (  2017/03/17 1014)   PRN Meds: sodium chloride  Allergies:   Allergies no known allergies  Social History:   Social History   Social History  . Marital status: Married    Spouse name: N/A  . Number of children: N/A  . Years of education: N/A   Occupational History  . Not on file.   Social History Main Topics  . Smoking status: Current Every Day Smoker    Packs/day: 1.00  . Smokeless tobacco: Never Used  . Alcohol use Yes     Comment:  pint daily or greater  . Drug use: No  . Sexual activity: Yes   Other Topics Concern  . Not on file   Social History Narrative  . No narrative on file    Family History:   The patient's family history includes Healthy in his sister; Hypertension in  his mother.  ROS:  Please see the history of present illness.  Below is from wife  General:sick X 3 days did work yesterday no fever.  Skin:no rashes or ulcers HEENT:no blurred vision, no congestion CV:see HPI PUL:see HPI GI:+ diarrhea constipation or melena, no indigestion GU:no hematuria, no dysuria MS:no joint pain, no claudication Neuro:+ near syncope + lightheadedness Endo:+ diabetes, no thyroid disease   Physical Exam/Data:   Vitals:   03/17/17 1300 17-Mar-2017 1315 03/17/2017 1330 03-17-2017 1345  BP: (!) 83/35 (!) 78/30 (!) 78/29 (!) 77/23  Pulse: 74 74 73 74  Resp: (!) 32 (!) 32 (!) 32 (!) 32  Temp: (!) 94.3 F (34.6 C) (!) 94.5 F (34.7 C) (!) 94.8 F (34.9 C) (!) 95 F (35 C)  TempSrc:      SpO2: 97% 96% 96% 96%  Weight:      Height:       No intake or output data in the 24 hours ending Mar 17, 2017 1358 Filed Weights   03-17-2017 0800 2017/03/17 0839  Weight: 264 lb 8.8 oz (120 kg) 264 lb 8.8 oz (120 kg)   Body mass index is 36.9 kg/m.  General:  Pt is intubated on vent non responsive  HEENT: face is swollen  Lymph: no adenopathy Neck: no JVD,  thick short neck Endocrine:  No thryomegaly Vascular: No carotid bruits; FA pulses 2+ bilaterally without bruits  Cardiac:  normal S1, S2; RRR; no murmur  Lungs:  clear to auscultation bilaterally ant only, no wheezing, rhonchi or rales  Abd: obese, soft, nontender, no hepatomegaly  Ext: no edema Musculoskeletal:  No deformities,  But feet both blue + capillary refill but Systolic BP 70 Skin: warm and dry  Neuro:  Unresponsive on vent - metabolic encephalopathy.  Psych:  Unresponsive    EKG:  The EKG was personally reviewed see HPI  Relevant CV Studies: Will order echo  Laboratory Data:  Chemistry  Recent Labs Lab Mar 17, 2017 0754 17-Mar-2017 0859 Mar 17, 2017 1136  NA 132* 136 138  K 7.0* >7.5* >7.5*  CL 106 97* 99*  CO2  --  <7* <7*  GLUCOSE 71 115* 155*  BUN 86* 75* 74*  CREATININE 13.50* 12.40* 11.88*    CALCIUM  --  7.5* 7.3*  GFRNONAA  --  4* 4*  GFRAA  --  5* 5*  ANIONGAP  --  NOT CALCULATED NOT CALCULATED     Recent Labs Lab Mar 17, 2017 0859 Mar 17, 2017 1136  PROT 5.0* 5.1*  ALBUMIN 2.5* 2.6*  AST 103* 137*  ALT 50 58  ALKPHOS 100 109  BILITOT 1.3* 1.7*   Hematology  Recent Labs  Lab 02/27/2017 0754 02/19/2017 0859 02/27/2017 1136  WBC  --  5.6 8.1  RBC  --  2.52* 2.74*  HGB 15.6 9.2* 10.1*  HCT 46.0 31.1* 32.9*  MCV  --  123.4* 120.1*  MCH  --  36.5* 36.9*  MCHC  --  29.6* 30.7  RDW  --  12.8 13.1  PLT  --  89* 85*   Cardiac Enzymes  Recent Labs Lab 03/03/2017 1136  TROPONINI 0.09*     Recent Labs Lab 03/17/2017 0753  TROPIPOC 0.13*    BNPNo results for input(s): BNP, PROBNP in the last 168 hours.  DDimer No results for input(s): DDIMER in the last 168 hours.  Radiology/Studies:  Ct Abdomen Pelvis Wo Contrast  Result Date: 03/01/2017 CLINICAL DATA:  Hypoxemia. Pt intubated. Unable to obtain hx from pt. Per RN pt was complaining of pain both sides. Pt has brown substance coming from OG tube. Pt has 15 lactic acid and 13.50 creatinine. Pt not on dialysis. EXAM: CT CHEST, ABDOMEN AND PELVIS WITHOUT CONTRAST TECHNIQUE: Multidetector CT imaging of the chest, abdomen and pelvis was performed following the standard protocol without IV contrast. COMPARISON:  None. FINDINGS: CT CHEST FINDINGS Cardiovascular: No significant vascular findings. Normal heart size. No pericardial effusion. Coronary artery atherosclerosis in the LAD. Mediastinum/Nodes: No enlarged mediastinal, hilar, or axillary lymph nodes. Thyroid gland, trachea, and esophagus demonstrate no significant findings. Lungs/Pleura: Small bilateral pleural effusions. Bilateral lower lobe airspace disease which may reflect atelectasis versus pneumonia. Musculoskeletal: No acute osseous abnormality. No aggressive lytic or sclerotic osseous lesion. Anterior bridging thoracic spine osteophytes as can be seen with diffuse  idiopathic skeletal hyperostosis. CT ABDOMEN PELVIS FINDINGS Hepatobiliary: Low attenuation of the liver as can be seen with hepatic steatosis. Normal gallbladder. Pancreas: Unremarkable. No pancreatic ductal dilatation or surrounding inflammatory changes. Spleen: Normal in size without focal abnormality. Adrenals/Urinary Tract: Normal adrenal glands. Normal kidneys. Mild nonspecific bilateral perinephric stranding. No urolithiasis or obstructive uropathy. Decompressed bladder. Stomach/Bowel: No bowel dilatation or bowel wall thickening. Sigmoid colon and descending colon diverticulosis without evidence of diverticulitis. No pneumatosis, pneumoperitoneum or portal venous gas. Nasogastric tube in the stomach. Mild fat stranding in the mesenteries. Vascular/Lymphatic: Normal caliber abdominal aorta. No lymphadenopathy. Reproductive: Prostate is unremarkable. Other: No abdominal wall hernia or abnormality. No abdominopelvic ascites. Musculoskeletal: No acute osseous abnormality. No lytic or sclerotic osseous lesion. Severe degenerative disc disease with disc height loss at L5-S1. Broad-based disc bulges at L4-5 and L5-S1. IMPRESSION: 1. Bilateral small pleural effusions. Bilateral lower lobe airspace disease which may reflect atelectasis versus pneumonia. 2. Diverticulosis without evidence of diverticulitis. 3. Hepatic steatosis. 4. Mild mesenteric haziness which may be secondary to mild edema versus mild mesenteritis. Electronically Signed   By: Elige KoHetal  Patel   On: 02/24/2017 08:49   Ct Chest Wo Contrast  Result Date: 03/15/2017 CLINICAL DATA:  Hypoxemia. Pt intubated. Unable to obtain hx from pt. Per RN pt was complaining of pain both sides. Pt has brown substance coming from OG tube. Pt has 15 lactic acid and 13.50 creatinine. Pt not on dialysis. EXAM: CT CHEST, ABDOMEN AND PELVIS WITHOUT CONTRAST TECHNIQUE: Multidetector CT imaging of the chest, abdomen and pelvis was performed following the standard protocol  without IV contrast. COMPARISON:  None. FINDINGS: CT CHEST FINDINGS Cardiovascular: No significant vascular findings. Normal heart size. No pericardial effusion. Coronary artery atherosclerosis in the LAD. Mediastinum/Nodes: No enlarged mediastinal, hilar, or axillary lymph nodes. Thyroid gland, trachea, and esophagus demonstrate no significant findings. Lungs/Pleura: Small bilateral pleural  effusions. Bilateral lower lobe airspace disease which may reflect atelectasis versus pneumonia. Musculoskeletal: No acute osseous abnormality. No aggressive lytic or sclerotic osseous lesion. Anterior bridging thoracic spine osteophytes as can be seen with diffuse idiopathic skeletal hyperostosis. CT ABDOMEN PELVIS FINDINGS Hepatobiliary: Low attenuation of the liver as can be seen with hepatic steatosis. Normal gallbladder. Pancreas: Unremarkable. No pancreatic ductal dilatation or surrounding inflammatory changes. Spleen: Normal in size without focal abnormality. Adrenals/Urinary Tract: Normal adrenal glands. Normal kidneys. Mild nonspecific bilateral perinephric stranding. No urolithiasis or obstructive uropathy. Decompressed bladder. Stomach/Bowel: No bowel dilatation or bowel wall thickening. Sigmoid colon and descending colon diverticulosis without evidence of diverticulitis. No pneumatosis, pneumoperitoneum or portal venous gas. Nasogastric tube in the stomach. Mild fat stranding in the mesenteries. Vascular/Lymphatic: Normal caliber abdominal aorta. No lymphadenopathy. Reproductive: Prostate is unremarkable. Other: No abdominal wall hernia or abnormality. No abdominopelvic ascites. Musculoskeletal: No acute osseous abnormality. No lytic or sclerotic osseous lesion. Severe degenerative disc disease with disc height loss at L5-S1. Broad-based disc bulges at L4-5 and L5-S1. IMPRESSION: 1. Bilateral small pleural effusions. Bilateral lower lobe airspace disease which may reflect atelectasis versus pneumonia. 2.  Diverticulosis without evidence of diverticulitis. 3. Hepatic steatosis. 4. Mild mesenteric haziness which may be secondary to mild edema versus mild mesenteritis. Electronically Signed   By: Elige Ko   On: 03/19/2017 08:49   US Renal  Result Date: 03/01/2017 CLINICAL DATA:  Elevated creatinine EXAM: RENAL / URINARY TRACT ULTRASOUND COMPLETE COMPARISON:  None. FINDINGS: Right Kidney: Length: 14.1 cm. Echogenicity within normal limits. No mass or hydronephrosis visualized. Left Kidney: Length: 14.4 cm. Echogenicity within normal limits. No mass or hydronephrosis visualized. Bladder: Decompressed by Foley catheter. IMPRESSION: No acute abnormality noted. Electronically Signed   By: Alcide Clever M.D.   On: 03/05/2017 12:32   Dg Chest Portable 1 View  Result Date: 03/07/2017 CLINICAL DATA:  Status post central line placement EXAM: PORTABLE CHEST 1 VIEW COMPARISON:  02/27/2017 FINDINGS: Endotracheal tube, nasogastric catheter and left jugular central line are not seen. The central line tip is noted in the proximal superior vena cava. No pneumothorax is seen. Stable changes are noted in the right mid lung. No new focal abnormality is seen. IMPRESSION: Status post central line placement as described. The remainder of the study is stable from the previous exam. Electronically Signed   By: Alcide Clever M.D.   On: 02/21/2017 10:18   Dg Chest Portable 1 View  Result Date: 02/28/2017 CLINICAL DATA:  Hypoxia EXAM: PORTABLE CHEST 1 VIEW COMPARISON:  December 31, 2016 FINDINGS: Endotracheal tube tip is 4.0 cm above the carina. Nasogastric tube tip and side port are in stomach. No pneumothorax. There is new atelectatic change in the mid lung. Lungs elsewhere are clear. Heart size and pulmonary vascularity are within normal limits. No adenopathy. No bone lesions. IMPRESSION: Tube positions as described without pneumothorax. Right midlung atelectasis. Lungs elsewhere clear. Heart size within normal limits. Electronically  Signed   By: Bretta Bang III M.D.   On: 03/12/2017 08:05    Assessment and Plan:   1. bradycardia junctional rhythm on arrival, due to acute renal failure and hyperkalemia now improved with HR SR in the 70s   2. Elevated troponin due to acute sepsis.  Demand ischemia will check echo -- on CTof chest did have CAD in LAD but this was not angio due to renal failure.  Hx of neg stress test in 2015 and neg troponins recently at [facility].    3. Sepsis  with shock Lactic acid is 15, on ABX followed by CCM -- on levophed  --on vanc and cefepime    4.  Acute renal failure at 12 Cr and K+ > 7.5 now.  Renal u/s normal.  Per CCM   5. Hx of liver abscess X 2 in past with I&D and drains.  Now with elevated LFTs.  And amylase  6. auto anticoagulated with INR 2.29  7. Diabetes -2 per CCM  8.  Etoh abuse > 1 pint per day  9.  Tobacco use 1ppd.  10.  Acute respiratory failure on vent followed by CCM     Pt is very ill, do not believe this is cardiac issue, hold BB, and ACE, check echo serial troponins, no need for heparin with INR elevated.  Dr. Royann Shivers has seen.     Signed, Nada Boozer, NP  03/14/2017 1:58 PM   I have seen and examined the patient along with Nada Boozer, NP.  I have reviewed the chart, notes and new data.  I agree with NP's note.  Key new complaints: intubated and unresponsive Key examination changes: hypothermic, cold cyanotic extremities, hypotensive despite pressors, anuric, normal heart rate and rhythm Key new findings / data: severe metabolic acidosis (lactic), severe hyperkalemia, acute hypoxic respiratory failure, very mild elevation in troponin, ECG with repolarization changes attributable to hyperkalemia and acidosis, no ischemic ST elevation or depression. Coronary calcification in the LAD artery distribution  PLAN: Septic shock.  Severe elevation in creatinine and phosphorus suggests the illness began at least several days ago. Bradycardia is due to severe  metabolic abnormalities and has quickly resolved with partial improvement of acidosis. The rhythm and ECG changes and mild troponin increase can all be explained by shock and its metabolic consequences. Echo may show global LV hypokinesis in these circumstances. If EF is abnormal would revaluate when/if the acidosis is corrected. There is indeed severe calcification in the LAD artery, but reportedly normal stress test not long ago. If he had an anterior MI, would expect troponin to be in the 50-100 range. ECG without changes of anterior infarction. If he has a hi-grade LAD stenosis, may have an anterior wall motion abnormality due to severe hypotension. Again, would repeat echo in several days if this is found. Prognosis is grim.  Thurmon Fair, MD, [facility] [phone] 03/16/2017, 2:22 PM

## 2017-03-06 NOTE — Progress Notes (Addendum)
MEDICATION RELATED CONSULT NOTE - INITIAL   Pharmacy Consult for Angiotensin 2 acetate Andres Sanchez(Giapreza) Indication: Hemodynamic instability  No Known Allergies  Patient Measurements: Height: 5\' 11"  (180.3 cm) Weight: 264 lb 8.8 oz (120 kg) IBW/kg (Calculated) : 75.3  Vital Signs: Temp: 97.5 F (36.4 C) (05/17 1700) Temp Source: Temporal (05/17 0730) BP: 75/21 (05/17 1700) Pulse Rate: 77 (05/17 1700)   Assessment: 51 yo M presents on 5/17 with bilateral rib pain. Found to be septic. Now in ICU on Levophed and vasopressor. CCM consulted pharmacy to add Giapreza. Last BP was 75/21. Meets all P&T restriction criteria.  Goal of Therapy:  MAP > 65  Plan:  Start angiotensin II acetate (Giapreza) at 5ng/kg/min. Titrate as needed per admin instructions Continue on Levophed and vasopressor per MD Monitor vital signs  Limit therapy to 48 hrs per P&T approval  Enzo BiNathan Hassani Sliney, PharmD, BCPS Clinical Pharmacist Pager [phone] 03/12/2017 5:54 PM

## 2017-03-06 NOTE — H&P (Signed)
PULMONARY / CRITICAL CARE MEDICINE   Name: Andres Sanchez. MRN: [id number] DOB: [birth year]    ADMISSION DATE:  03/03/2017   REFERRING MD:  EDP  CHIEF COMPLAINT: bilateral rib pain  HISTORY OF PRESENT ILLNESS:   51 yo WM with a history of Angina , morbid obesity who per his wife became ill over the the weekend with malaise , fever and pain bilateral ribs. He continued to decline and EMS was activated 5/17 and when EMS arrived he was awake , alert and cooperative. In route to hospital he became unresponsive, bradycardic and required external pacemaker. In ED he was hypothermic (70F)  bradycardic 58, hypotensive and was intubated by EDP and PCCM was consulted. His PH was 6.7, creatine 12.07, K+ 7.2. CVL and aline were placed, bicarb drip , abx  , vasopressors started. His prognosis appears grim.  PAST MEDICAL HISTORY :  He  has a past medical history of Diabetes mellitus without complication (HCC) and Hypertension.  PAST SURGICAL HISTORY: He  has no past surgical history on file.  Allergies not on file  No current facility-administered medications on file prior to encounter.    No current outpatient prescriptions on file prior to encounter.    FAMILY HISTORY:  His has no family status information on file.    SOCIAL HISTORY: He  reports that he has been smoking.  He does not have any smokeless tobacco history on file. He reports that he drinks alcohol. He reports that he does not use drugs.  REVIEW OF SYSTEMS:   NA  SUBJECTIVE:  52 yo male in acute distress   VITAL SIGNS: BP (!) 71/40   Pulse 68   Temp (!) 95 F (35 C) (Temporal)   Resp (!) 22   Ht 5\' 11"  (1.803 m)   Wt 264 lb 8.8 oz (120 kg)   SpO2 100%   BMI 36.90 kg/m   HEMODYNAMICS:    VENTILATOR SETTINGS: Vent Mode: PRVC FiO2 (%):  [100 %] 100 % Set Rate:  [18 bmp] 18 bmp Vt Set:  [600 mL] 600 mL PEEP:  [5 cmH20] 5 cmH20 Plateau Pressure:  [18 cmH20] 18 cmH20  INTAKE / OUTPUT: No intake/output data  recorded.  PHYSICAL EXAMINATION: General:  MOWM in acute distress. Mottled extremities and hypothermic  Neuro:NO follow commands, raises head at times HEENT:  No neck, no JVD/LAN Cardiovascular: HSD Lungs: Coarse rhonchi Abdomen: obese, decrease bs Musculoskeletal: intact Skin:  Hands and feet mottled, cool to touch  LABS:  BMET  Recent Labs Lab 02/24/2017 0754 03/13/2017 0859  NA 132* 136  K 7.0* >7.5*  CL 106 97*  CO2  --  <7*  BUN 86* 75*  CREATININE 13.50* 12.40*  GLUCOSE 71 115*   Electrolytes  Recent Labs Lab 03/02/2017 0859  CALCIUM 7.5*   CBC  Recent Labs Lab 02/25/2017 0754 03/05/2017 0859  WBC  --  5.6  HGB 15.6 9.2*  HCT 46.0 31.1*  PLT  --  89*   Coag's No results for input(s): APTT, INR in the last 168 hours.  Sepsis Markers  Recent Labs Lab 03/15/2017 0800  LATICACIDVEN 15.01*   ABG No results for input(s): PHART, PCO2ART, PO2ART in the last 168 hours.  Liver Enzymes  Recent Labs Lab 03/11/2017 0859  AST 103*  ALT 50  ALKPHOS 100  BILITOT 1.3*  ALBUMIN 2.5*   Cardiac Enzymes No results for input(s): TROPONINI, PROBNP in the last 168 hours.  Glucose  Recent Labs Lab 03/05/2017 0753  GLUCAP 62*    Imaging Ct Abdomen Pelvis Wo Contrast  Result Date: 03/20/2017 CLINICAL DATA:  Hypoxemia. Pt intubated. Unable to obtain hx from pt. Per RN pt was complaining of pain both sides. Pt has brown substance coming from OG tube. Pt has 15 lactic acid and 13.50 creatinine. Pt not on dialysis. EXAM: CT CHEST, ABDOMEN AND PELVIS WITHOUT CONTRAST TECHNIQUE: Multidetector CT imaging of the chest, abdomen and pelvis was performed following the standard protocol without IV contrast. COMPARISON:  None. FINDINGS: CT CHEST FINDINGS Cardiovascular: No significant vascular findings. Normal heart size. No pericardial effusion. Coronary artery atherosclerosis in the LAD. Mediastinum/Nodes: No enlarged mediastinal, hilar, or axillary lymph nodes. Thyroid gland,  trachea, and esophagus demonstrate no significant findings. Lungs/Pleura: Small bilateral pleural effusions. Bilateral lower lobe airspace disease which may reflect atelectasis versus pneumonia. Musculoskeletal: No acute osseous abnormality. No aggressive lytic or sclerotic osseous lesion. Anterior bridging thoracic spine osteophytes as can be seen with diffuse idiopathic skeletal hyperostosis. CT ABDOMEN PELVIS FINDINGS Hepatobiliary: Low attenuation of the liver as can be seen with hepatic steatosis. Normal gallbladder. Pancreas: Unremarkable. No pancreatic ductal dilatation or surrounding inflammatory changes. Spleen: Normal in size without focal abnormality. Adrenals/Urinary Tract: Normal adrenal glands. Normal kidneys. Mild nonspecific bilateral perinephric stranding. No urolithiasis or obstructive uropathy. Decompressed bladder. Stomach/Bowel: No bowel dilatation or bowel wall thickening. Sigmoid colon and descending colon diverticulosis without evidence of diverticulitis. No pneumatosis, pneumoperitoneum or portal venous gas. Nasogastric tube in the stomach. Mild fat stranding in the mesenteries. Vascular/Lymphatic: Normal caliber abdominal aorta. No lymphadenopathy. Reproductive: Prostate is unremarkable. Other: No abdominal wall hernia or abnormality. No abdominopelvic ascites. Musculoskeletal: No acute osseous abnormality. No lytic or sclerotic osseous lesion. Severe degenerative disc disease with disc height loss at L5-S1. Broad-based disc bulges at L4-5 and L5-S1. IMPRESSION: 1. Bilateral small pleural effusions. Bilateral lower lobe airspace disease which may reflect atelectasis versus pneumonia. 2. Diverticulosis without evidence of diverticulitis. 3. Hepatic steatosis. 4. Mild mesenteric haziness which may be secondary to mild edema versus mild mesenteritis. Electronically Signed   By: Elige Ko   On: 03/03/2017 08:49   Ct Chest Wo Contrast  Result Date: 03/14/2017 CLINICAL DATA:  Hypoxemia. Pt  intubated. Unable to obtain hx from pt. Per RN pt was complaining of pain both sides. Pt has brown substance coming from OG tube. Pt has 15 lactic acid and 13.50 creatinine. Pt not on dialysis. EXAM: CT CHEST, ABDOMEN AND PELVIS WITHOUT CONTRAST TECHNIQUE: Multidetector CT imaging of the chest, abdomen and pelvis was performed following the standard protocol without IV contrast. COMPARISON:  None. FINDINGS: CT CHEST FINDINGS Cardiovascular: No significant vascular findings. Normal heart size. No pericardial effusion. Coronary artery atherosclerosis in the LAD. Mediastinum/Nodes: No enlarged mediastinal, hilar, or axillary lymph nodes. Thyroid gland, trachea, and esophagus demonstrate no significant findings. Lungs/Pleura: Small bilateral pleural effusions. Bilateral lower lobe airspace disease which may reflect atelectasis versus pneumonia. Musculoskeletal: No acute osseous abnormality. No aggressive lytic or sclerotic osseous lesion. Anterior bridging thoracic spine osteophytes as can be seen with diffuse idiopathic skeletal hyperostosis. CT ABDOMEN PELVIS FINDINGS Hepatobiliary: Low attenuation of the liver as can be seen with hepatic steatosis. Normal gallbladder. Pancreas: Unremarkable. No pancreatic ductal dilatation or surrounding inflammatory changes. Spleen: Normal in size without focal abnormality. Adrenals/Urinary Tract: Normal adrenal glands. Normal kidneys. Mild nonspecific bilateral perinephric stranding. No urolithiasis or obstructive uropathy. Decompressed bladder. Stomach/Bowel: No bowel dilatation or bowel wall thickening. Sigmoid colon and descending colon diverticulosis without evidence of diverticulitis. No pneumatosis, pneumoperitoneum  or portal venous gas. Nasogastric tube in the stomach. Mild fat stranding in the mesenteries. Vascular/Lymphatic: Normal caliber abdominal aorta. No lymphadenopathy. Reproductive: Prostate is unremarkable. Other: No abdominal wall hernia or abnormality. No  abdominopelvic ascites. Musculoskeletal: No acute osseous abnormality. No lytic or sclerotic osseous lesion. Severe degenerative disc disease with disc height loss at L5-S1. Broad-based disc bulges at L4-5 and L5-S1. IMPRESSION: 1. Bilateral small pleural effusions. Bilateral lower lobe airspace disease which may reflect atelectasis versus pneumonia. 2. Diverticulosis without evidence of diverticulitis. 3. Hepatic steatosis. 4. Mild mesenteric haziness which may be secondary to mild edema versus mild mesenteritis. Electronically Signed   By: Elige KoHetal  Patel   On: 02/23/2017 08:49   Dg Chest Portable 1 View  Result Date: 02/21/2017 CLINICAL DATA:  Hypoxia EXAM: PORTABLE CHEST 1 VIEW COMPARISON:  December 31, 2016 FINDINGS: Endotracheal tube tip is 4.0 cm above the carina. Nasogastric tube tip and side port are in stomach. No pneumothorax. There is new atelectatic change in the mid lung. Lungs elsewhere are clear. Heart size and pulmonary vascularity are within normal limits. No adenopathy. No bone lesions. IMPRESSION: Tube positions as described without pneumothorax. Right midlung atelectasis. Lungs elsewhere clear. Heart size within normal limits. Electronically Signed   By: Bretta BangWilliam  Woodruff III M.D.   On: 03/19/2017 08:05     STUDIES:  5/17 2 d >> 5/17 renal US>>  CULTURES: 5/17 sputum>> 5/17 bc x 2>> 5/17 uc>>   ANTIBIOTICS: 5/17 vanc>> 5/17 cefepime>>  SIGNIFICANT EVENTS: 5/17 admit in severe septic shock  LINES/TUBES: 5/17 ET>> 5/17 LIJVCL>> 5/17 Rt Fem Aline>>  DISCUSSION: 51 yo WM with a history of Angina , morbid obesity who per his wife became ill over the the weekend with malaise , fever and pain bilateral ribs. He continued to decline and EMS was activated 5/17 and when EMS arrived he was awake , alert and cooperative. In route to hospital he became unresponsive, bradycardic and required external pacemaker. In ED he was hypothermic (52F)  bradycardic 58, hypotensive and was  intubated by EDP and PCCM was consulted. His PH was 6.7, creatine 12.07, K+ 7.2. CVL and aline were placed, bicarb drip , abx  , vasopressors started. His prognosis appears grim.    ASSESSMENT / PLAN:  PULMONARY A: VDRF secondary to sepsis/shock P:   Vent bundle Maximize rr to offset metabolic acidosis   CARDIOVASCULAR A:  Shock/sepsis Hx on angina  Bradycardia P:  Abx Fluid resuscitation Serial troponin  Consult cards  RENAL A:   Acute renal failure Hyperkalemia Metabolic acidosis  P:   Renal consult called Bicarb drip 125 cc/hr Vent rate increased to 32 Renal US if creatine does not improve Correcting ph such correct K+  GASTROINTESTINAL A:   GI protection Possible abd source of infection P:   PPI ? Contrasted ct abd  HEMATOLOGIC A:   Anemia Thrombocytopenia P:  Transfuse per protocol No heparin Check coags  INFECTIOUS A:   Presumed infectious without obvious source P:   Pan culture Broad spectrum abx Vanc/cefepime   ENDOCRINE A:   Monitor glucose ? Adrenal insuff  P:   CBG Stress steroids after cortisol level drawn  NEUROLOGIC A:   Obtunded from presumed metabolic encephalopathy  P:   RASS goal: 0 Sedation as needed   FAMILY  - Updates: None at bedside  - Inter-disciplinary family meet or Palliative Care meeting due by:  day 7    Steve Minor ACNP [facility] P[facility] Pager [phone] till 3 pm If  no answer page [phone] 03/03/2017, 10:12 AM  Attending Note:  51 year old alcoholic male who presents to [facility] with chest pain, SOB and malaise who lost consciousness in the ambulance and was subsequently intubated.  Developed shock and pressors were started.  PCCM was called to admit.  On exam, patient is mottled with bibasilar crackles.  I reviewed CXR myself, bibasilar infiltrate noted.  Will admit to the ICU.  Start cefepime and vanc via pharmacy.  Renal and cardiology consults called.  Abdominal exam is relatively benign so I do not  believe abdominal sepsis is a source here, I believe it is pulmonary.  Will treat hyperkalemia and recheck BMET.  Per discussion with renal, will repeat BMET now and determine need for HD.  Patient has a very poor prognosis given MODS.  The patient is critically ill with multiple organ systems failure and requires high complexity decision making for assessment and support, frequent evaluation and titration of therapies, application of advanced monitoring technologies and extensive interpretation of multiple databases.   Critical Care Time devoted to patient care services described in this note is  35  Minutes. This time reflects time of care of this signee Dr Koren Bound. This critical care time does not reflect procedure time, or teaching time or supervisory time of PA/NP/Med student/Med Resident etc but could involve care discussion time.  Alyson Reedy, M.D. [facility] Pulmonary/Critical Care Medicine. Pager: [phone]. After hours pager: [phone].

## 2017-03-06 NOTE — Progress Notes (Signed)
CRITICAL VALUE ALERT  Critical value received:  Lactic Acid 15  Date of notification: 2017-04-23  Time of notification:  2126  Critical value read back: yes  Nurse who received alert:  Julius BowelsFelicia Jameson Morrow, RN   MD notified (1st page):  Sommer  Time of first page:  [phone]

## 2017-03-06 NOTE — Progress Notes (Signed)
Pharmacy Antibiotic Note  Andres Sanchez. is a 51 y.o. male admitted on 03/05/2017 with SOB and bradycardia. Pt then had unresponsive episode and required mechanical ventilation. Planning to start broad spectrum antibiotics for sepsis. WBC 5.6, SCr 12.4, K > 7, LFTs elevated, LA 15. Cefepime x1 given in ED.    Plan: -Vancomycin 2500 mg IV x1  -Maintenance doses based off of random levels or recovery of renal fx -Monitor cultures, renal fx, uop, VR as indicated  -Cefepime 1 g IV q24h  Height: 5\' 11"  (180.3 cm) Weight: 264 lb 8.8 oz (120 kg) IBW/kg (Calculated) : 75.3  Temp (24hrs), Avg:93 F (33.9 C), Min:90 F (32.2 C), Max:95.2 F (35.1 C)   Recent Labs Lab 03/04/2017 0754 02/26/2017 0800 02/18/2017 0859 02/18/2017 1125 03/05/2017 1136  WBC  --   --  5.6  --  8.1  CREATININE 13.50*  --  12.40*  --  11.88*  LATICACIDVEN  --  15.01*  --  15.7*  --      Antimicrobials this admission: 5/17 cefepime > 5/17 vancomycin >   Dose adjustments this admission: N/A   Microbiology results: 5/17 blood cx: 5/17 urine cx:   ADDENDUM: Patient to start CRRT   Plan: Cefepime 2g IV Q12H Vancomycin 1250 mg IV Q24H Monitor CRRT plan and off times   Mackie Paienee Cortez Steelman, PharmD PGY1 Pharmacy Resident Rx ED [phone]#[id number] 03/10/2017 2:59 PM

## 2017-03-06 NOTE — Procedures (Signed)
Central Venous Catheter Insertion Procedure Note Andres RileyJohn R Dilday Jr. [id number] 03/24/1966  Procedure: Insertion of Central Venous Catheter Indications: Assessment of intravascular volume, Drug and/or fluid administration and Frequent blood sampling  Procedure Details Consent: Unable to obtain consent because of emergent medical necessity. Time Out: Verified patient identification, verified procedure, site/side was marked, verified correct patient position, special equipment/implants available, medications/allergies/relevent history reviewed, required imaging and test results available.  Performed  Maximum sterile technique was used including antiseptics, cap, gloves, gown, hand hygiene, mask and sheet. Skin prep: Chlorhexidine; local anesthetic administered A antimicrobial bonded/coated triple lumen catheter was placed in the left internal jugular vein using the Seldinger technique. Ultrasound guidance used.Yes.   Catheter placed to 20 cm. Blood aspirated via all 3 ports and then flushed x 3. Line sutured x 2 and dressing applied.  Evaluation Blood flow good Complications: No apparent complications Patient did tolerate procedure well. Chest X-ray ordered to verify placement.  CXR: normal.  Andres CanalesSteve Sanchez ACNP [facility] PCCM Pager [phone] till 3 pm If no answer page [phone] 02/20/2017, 10:16 AM  Andres ReedyWesam G. Eleanna Sanchez, M.D. [facility] Pulmonary/Critical Care Medicine. Pager: [phone]. After hours pager: [phone].

## 2017-03-06 NOTE — Progress Notes (Signed)
Pharmacy Antibiotic Note  Andres RileyJohn R Lares Jr. is a 51 y.o. male admitted on 2016-11-28 with SOB and bradycardia. Pt then had unresponsive episode and required mechanical ventilation. Planning to start broad spectrum antibiotics for sepsis. WBC 5.6, SCr 12.4, K > 7, LFTs elevated, LA 15. Cefepime x1 given in ED.    Plan: -Vancomycin 2500 mg IV x1  -Maintenance doses based off of random levels or recovery of renal fx -Monitor cultures, renal fx, uop, VR as indicated  -Cefepime 1 g IV q24h  Height: 5\' 11"  (180.3 cm) Weight: 264 lb 8.8 oz (120 kg) IBW/kg (Calculated) : 75.3  Temp (24hrs), Avg:95 F (35 C), Min:95 F (35 C), Max:95 F (35 C)   Recent Labs Lab Jul 18, 2017 0754 Jul 18, 2017 0800  CREATININE 13.50*  --   LATICACIDVEN  --  15.01*     Antimicrobials this admission: 5/17 cefepime > 5/17 vancomycin >   Dose adjustments this admission: N/A   Microbiology results: 5/17 blood cx: 5/17 urine cx:   Andres Sanchez, Andres Sanchez 2016-11-28 9:11 AM

## 2017-03-06 NOTE — ED Notes (Signed)
Wife Rise PatienceMichelle Spear cell- [phone]

## 2017-03-06 NOTE — ED Notes (Signed)
Quad strenght levophed requested from ED pharmacy per Joneen RoachPaul Hoffman, NP's verbal order

## 2017-03-07 ENCOUNTER — Inpatient Hospital Stay (HOSPITAL_COMMUNITY): Payer: 59

## 2017-03-07 DIAGNOSIS — Q244 Congenital subaortic stenosis: Secondary | ICD-10-CM

## 2017-03-07 DIAGNOSIS — E872 Acidosis: Secondary | ICD-10-CM

## 2017-03-07 DIAGNOSIS — R0600 Dyspnea, unspecified: Secondary | ICD-10-CM

## 2017-03-07 DIAGNOSIS — R0902 Hypoxemia: Secondary | ICD-10-CM

## 2017-03-07 LAB — POCT I-STAT 3, ART BLOOD GAS (G3+)
ACID-BASE DEFICIT: 14 mmol/L — AB (ref 0.0–2.0)
ACID-BASE DEFICIT: 15 mmol/L — AB (ref 0.0–2.0)
BICARBONATE: 13 mmol/L — AB (ref 20.0–28.0)
Bicarbonate: 11.9 mmol/L — ABNORMAL LOW (ref 20.0–28.0)
O2 SAT: 88 %
O2 Saturation: 96 %
PCO2 ART: 27.9 mmHg — AB (ref 32.0–48.0)
PH ART: 7.212 — AB (ref 7.350–7.450)
TCO2: 14 mmol/L (ref 0–100)
TCO2: 13 mmol/L (ref 0–100)
pCO2 arterial: 31.6 mmHg — ABNORMAL LOW (ref 32.0–48.0)
pH, Arterial: 7.23 — ABNORMAL LOW (ref 7.350–7.450)
pO2, Arterial: 92 mmHg (ref 83.0–108.0)
pO2, Arterial: 60 mmHg — ABNORMAL LOW (ref 83.0–108.0)

## 2017-03-07 LAB — BASIC METABOLIC PANEL
ANION GAP: 39 — AB (ref 5–15)
BUN: 49 mg/dL — AB (ref 6–20)
CO2: 10 mmol/L — ABNORMAL LOW (ref 22–32)
Calcium: 5.9 mg/dL — CL (ref 8.9–10.3)
Chloride: 91 mmol/L — ABNORMAL LOW (ref 101–111)
Creatinine, Ser: 7.55 mg/dL — ABNORMAL HIGH (ref 0.61–1.24)
GFR calc Af Amer: 9 mL/min — ABNORMAL LOW (ref >60)
GFR, EST NON AFRICAN AMERICAN: 8 mL/min — AB (ref >60)
Glucose, Bld: 347 mg/dL — ABNORMAL HIGH (ref 65–99)
POTASSIUM: 4 mmol/L (ref 3.5–5.1)
SODIUM: 140 mmol/L (ref 135–145)

## 2017-03-07 LAB — CBC
HCT: 30.4 % — ABNORMAL LOW (ref 39.0–52.0)
Hemoglobin: 9.3 g/dL — ABNORMAL LOW (ref 13.0–17.0)
MCH: 35.1 pg — ABNORMAL HIGH (ref 26.0–34.0)
MCHC: 30.6 g/dL (ref 30.0–36.0)
MCV: 114.7 fL — ABNORMAL HIGH (ref 78.0–100.0)
PLATELETS: 65 10*3/uL — AB (ref 150–400)
RBC: 2.65 MIL/uL — AB (ref 4.22–5.81)
RDW: 12.7 % (ref 11.5–15.5)
WBC: 5.1 10*3/uL (ref 4.0–10.5)

## 2017-03-07 LAB — LACTIC ACID, PLASMA
LACTIC ACID, VENOUS: 21 mmol/L — AB (ref 0.5–1.9)
LACTIC ACID, VENOUS: 17.4 mmol/L — AB (ref 0.5–1.9)
Lactic Acid, Venous: 18.9 mmol/L (ref 0.5–1.9)

## 2017-03-07 LAB — BLOOD GAS, ARTERIAL
Acid-base deficit: 17.9 mmol/L — ABNORMAL HIGH (ref 0.0–2.0)
Acid-base deficit: 21.8 mmol/L — ABNORMAL HIGH (ref 0.0–2.0)
BICARBONATE: 9.8 mmol/L — AB (ref 20.0–28.0)
BICARBONATE: 7.1 mmol/L — AB (ref 20.0–28.0)
Drawn by: 404151
FIO2: 50
FIO2: 50
LHR: 35 {breaths}/min
MECHVT: 600 mL
O2 Saturation: 92.6 %
O2 Saturation: 94.3 %
PEEP/CPAP: 5 cmH2O
PEEP: 5 cmH2O
Patient temperature: 95
Patient temperature: 95.4
RATE: 35 resp/min
VT: 600 mL
pCO2 arterial: 27.2 mmHg — ABNORMAL LOW (ref 32.0–48.0)
pCO2 arterial: 29.8 mmHg — ABNORMAL LOW (ref 32.0–48.0)
pH, Arterial: 7.028 — CL (ref 7.350–7.450)
pH, Arterial: 7.129 — CL (ref 7.350–7.450)
pO2, Arterial: 68.7 mmHg — ABNORMAL LOW (ref 83.0–108.0)
pO2, Arterial: 84.8 mmHg (ref 83.0–108.0)

## 2017-03-07 LAB — RESPIRATORY PANEL BY PCR
Adenovirus: NOT DETECTED
BORDETELLA PERTUSSIS-RVPCR: NOT DETECTED
CHLAMYDOPHILA PNEUMONIAE-RVPPCR: NOT DETECTED
CORONAVIRUS 229E-RVPPCR: NOT DETECTED
CORONAVIRUS OC43-RVPPCR: NOT DETECTED
Coronavirus HKU1: NOT DETECTED
Coronavirus NL63: NOT DETECTED
INFLUENZA A-RVPPCR: NOT DETECTED
INFLUENZA B-RVPPCR: NOT DETECTED
MYCOPLASMA PNEUMONIAE-RVPPCR: NOT DETECTED
Metapneumovirus: NOT DETECTED
PARAINFLUENZA VIRUS 4-RVPPCR: NOT DETECTED
Parainfluenza Virus 1: NOT DETECTED
Parainfluenza Virus 2: NOT DETECTED
Parainfluenza Virus 3: NOT DETECTED
RESPIRATORY SYNCYTIAL VIRUS-RVPPCR: NOT DETECTED
Rhinovirus / Enterovirus: NOT DETECTED

## 2017-03-07 LAB — GLUCOSE, CAPILLARY
GLUCOSE-CAPILLARY: 332 mg/dL — AB (ref 65–99)
GLUCOSE-CAPILLARY: 368 mg/dL — AB (ref 65–99)
GLUCOSE-CAPILLARY: 321 mg/dL — AB (ref 65–99)
GLUCOSE-CAPILLARY: 216 mg/dL — AB (ref 65–99)
GLUCOSE-CAPILLARY: 185 mg/dL — AB (ref 65–99)
GLUCOSE-CAPILLARY: 113 mg/dL — AB (ref 65–99)
Glucose-Capillary: 290 mg/dL — ABNORMAL HIGH (ref 65–99)
Glucose-Capillary: 272 mg/dL — ABNORMAL HIGH (ref 65–99)
Glucose-Capillary: 152 mg/dL — ABNORMAL HIGH (ref 65–99)
Glucose-Capillary: 116 mg/dL — ABNORMAL HIGH (ref 65–99)
Glucose-Capillary: 107 mg/dL — ABNORMAL HIGH (ref 65–99)
Glucose-Capillary: 109 mg/dL — ABNORMAL HIGH (ref 65–99)
Glucose-Capillary: 108 mg/dL — ABNORMAL HIGH (ref 65–99)

## 2017-03-07 LAB — HIV ANTIBODY (ROUTINE TESTING W REFLEX): HIV Screen 4th Generation wRfx: NONREACTIVE

## 2017-03-07 LAB — RENAL FUNCTION PANEL
ALBUMIN: 2.3 g/dL — AB (ref 3.5–5.0)
ANION GAP: 31 — AB (ref 5–15)
BUN: 33 mg/dL — ABNORMAL HIGH (ref 6–20)
CALCIUM: 5.5 mg/dL — AB (ref 8.9–10.3)
CO2: 16 mmol/L — AB (ref 22–32)
Chloride: 92 mmol/L — ABNORMAL LOW (ref 101–111)
Creatinine, Ser: 5.29 mg/dL — ABNORMAL HIGH (ref 0.61–1.24)
GFR, EST AFRICAN AMERICAN: 13 mL/min — AB (ref >60)
GFR, EST NON AFRICAN AMERICAN: 12 mL/min — AB (ref >60)
Glucose, Bld: 168 mg/dL — ABNORMAL HIGH (ref 65–99)
PHOSPHORUS: 2.1 mg/dL — AB (ref 2.5–4.6)
Potassium: 3 mmol/L — ABNORMAL LOW (ref 3.5–5.1)
SODIUM: 139 mmol/L (ref 135–145)

## 2017-03-07 LAB — HEMOGLOBIN AND HEMATOCRIT, BLOOD
HEMATOCRIT: 27.9 % — AB (ref 39.0–52.0)
HEMOGLOBIN: 9.2 g/dL — AB (ref 13.0–17.0)

## 2017-03-07 LAB — HEPATIC FUNCTION PANEL
ALT: 107 U/L — ABNORMAL HIGH (ref 17–63)
AST: 387 U/L — AB (ref 15–41)
Albumin: 2.3 g/dL — ABNORMAL LOW (ref 3.5–5.0)
Alkaline Phosphatase: 119 U/L (ref 38–126)
BILIRUBIN DIRECT: 1.8 mg/dL — AB (ref 0.1–0.5)
BILIRUBIN TOTAL: 3.1 mg/dL — AB (ref 0.3–1.2)
Indirect Bilirubin: 1.3 mg/dL — ABNORMAL HIGH (ref 0.3–0.9)
Total Protein: 4.4 g/dL — ABNORMAL LOW (ref 6.5–8.1)

## 2017-03-07 LAB — MAGNESIUM: MAGNESIUM: 1.9 mg/dL (ref 1.7–2.4)

## 2017-03-07 LAB — ECHOCARDIOGRAM COMPLETE
Height: 71 in
WEIGHTICAEL: 4938.3 [oz_av]

## 2017-03-07 LAB — TROPONIN I: TROPONIN I: 15.9 ng/mL — AB (ref <0.03)

## 2017-03-07 LAB — PROTIME-INR
INR: 1.79
PROTHROMBIN TIME: 21.1 s — AB (ref 11.4–15.2)

## 2017-03-07 LAB — HEPARIN LEVEL (UNFRACTIONATED): HEPARIN UNFRACTIONATED: 0.53 [IU]/mL (ref 0.30–0.70)

## 2017-03-07 MED ORDER — SODIUM CHLORIDE 0.9 % IV BOLUS (SEPSIS)
500.0000 mL | Freq: Once | INTRAVENOUS | Status: AC
  Administered 2017-03-07: 500 mL via INTRAVENOUS

## 2017-03-07 MED ORDER — SODIUM CHLORIDE 0.9 % IV SOLN
5.0000 ng/kg/min | INTRAVENOUS | Status: DC
  Administered 2017-03-07: 40 ng/kg/min via INTRAVENOUS
  Administered 2017-03-07: 5 ng/kg/min via INTRAVENOUS
  Administered 2017-03-07 – 2017-03-08 (×2): 40 ng/kg/min via INTRAVENOUS
  Filled 2017-03-07 (×7): qty 1

## 2017-03-07 MED ORDER — SODIUM CHLORIDE 0.9 % IV BOLUS (SEPSIS)
1000.0000 mL | Freq: Once | INTRAVENOUS | Status: AC
  Administered 2017-03-07: 1000 mL via INTRAVENOUS

## 2017-03-07 MED ORDER — SODIUM CHLORIDE 0.9 % IV SOLN
100.0000 mg | INTRAVENOUS | Status: DC
  Administered 2017-03-07: 100 mg via INTRAVENOUS
  Filled 2017-03-07 (×2): qty 100

## 2017-03-07 MED ORDER — SODIUM BICARBONATE 8.4 % IV SOLN
200.0000 meq | Freq: Once | INTRAVENOUS | Status: AC
  Administered 2017-03-07: 200 meq via INTRAVENOUS
  Filled 2017-03-07: qty 200

## 2017-03-07 MED ORDER — INSULIN ASPART 100 UNIT/ML ~~LOC~~ SOLN
0.0000 [IU] | SUBCUTANEOUS | Status: DC
  Administered 2017-03-08: 1 [IU] via SUBCUTANEOUS
  Administered 2017-03-08: 2 [IU] via SUBCUTANEOUS

## 2017-03-07 MED ORDER — HEPARIN (PORCINE) IN NACL 100-0.45 UNIT/ML-% IJ SOLN
1200.0000 [IU]/h | INTRAMUSCULAR | Status: DC
  Administered 2017-03-07 – 2017-03-08 (×2): 1500 [IU]/h via INTRAVENOUS
  Filled 2017-03-07 (×3): qty 250

## 2017-03-07 MED ORDER — SODIUM CHLORIDE 0.9 % IV BOLUS (SEPSIS)
2000.0000 mL | Freq: Once | INTRAVENOUS | Status: AC
  Administered 2017-03-07: 2000 mL via INTRAVENOUS

## 2017-03-07 MED ORDER — SODIUM CHLORIDE 0.9 % IV SOLN
2.0000 g | Freq: Once | INTRAVENOUS | Status: AC
  Administered 2017-03-07: 2 g via INTRAVENOUS
  Filled 2017-03-07: qty 20

## 2017-03-07 MED ORDER — SODIUM CHLORIDE 0.9 % IV SOLN
1.0000 g | Freq: Once | INTRAVENOUS | Status: AC
  Administered 2017-03-07: 1 g via INTRAVENOUS
  Filled 2017-03-07: qty 10

## 2017-03-07 MED ORDER — PRISMASOL BGK 4/2.5 32-4-2.5 MEQ/L IV SOLN
INTRAVENOUS | Status: DC
  Administered 2017-03-07: 11:00:00 via INTRAVENOUS_CENTRAL
  Filled 2017-03-07 (×2): qty 5000

## 2017-03-07 MED ORDER — PRISMASOL BGK 4/2.5 32-4-2.5 MEQ/L IV SOLN
INTRAVENOUS | Status: DC
  Filled 2017-03-07: qty 5000

## 2017-03-07 MED ORDER — HYDROCORTISONE NA SUCCINATE PF 100 MG IJ SOLR
50.0000 mg | Freq: Four times a day (QID) | INTRAMUSCULAR | Status: DC
  Administered 2017-03-07 – 2017-03-08 (×5): 50 mg via INTRAVENOUS
  Filled 2017-03-07 (×4): qty 1
  Filled 2017-03-07: qty 2

## 2017-03-07 MED ORDER — INSULIN REGULAR HUMAN 100 UNIT/ML IJ SOLN
INTRAMUSCULAR | Status: DC
  Administered 2017-03-07: 2.6 [IU]/h via INTRAVENOUS
  Filled 2017-03-07: qty 1

## 2017-03-07 MED ORDER — PRISMASOL BGK 4/2.5 32-4-2.5 MEQ/L IV SOLN
INTRAVENOUS | Status: DC
  Administered 2017-03-07 (×3): via INTRAVENOUS_CENTRAL
  Filled 2017-03-07 (×11): qty 5000

## 2017-03-07 NOTE — Progress Notes (Addendum)
Pt's 2230 ABG results given to Dr. Dellie CatholicSommers, new order given. Also advised to call renal to see if bicarb could be added to pt's CRRT. Dr Hyman HopesWebb from renal notified, bicarb added to pt;s CRRT replacement.

## 2017-03-07 NOTE — Progress Notes (Signed)
Inpatient Diabetes Program Recommendations  AACE/ADA: New Consensus Statement on Inpatient Glycemic Control (2015)  Target Ranges:  Prepandial:   less than 140 mg/dL      Peak postprandial:   less than 180 mg/dL (1-2 hours)      Critically ill patients:  140 - 180 mg/dL   Results for [name], Andres R JR. (MRN [id number]) as of 03/07/2017 09:32  Ref. Range 03/07/2017 04:52  Sodium Latest Ref Range: 135 - 145 mmol/L 140  Potassium Latest Ref Range: 3.5 - 5.1 mmol/L 4.0  Chloride Latest Ref Range: 101 - 111 mmol/L 91 (L)  CO2 Latest Ref Range: 22 - 32 mmol/L 10 (L)  Glucose Latest Ref Range: 65 - 99 mg/dL 109347 (H)  BUN Latest Ref Range: 6 - 20 mg/dL 49 (H)  Creatinine Latest Ref Range: 0.61 - 1.24 mg/dL 3.237.55 (H)  Calcium Latest Ref Range: 8.9 - 10.3 mg/dL 5.9 (LL)  Anion gap Latest Ref Range: 5 - 15  39 (H)    Admit with: Sepsis/ Shock/ Metabolic Acidosis/ Acute Renal Failure  History: DM, ETOH  Home DM Meds: Metformin 1000 mg BID  Current Insulin Orders: Novolog Sensitive Correction Scale/ SSI (0-9 units) Q4 hours     MD- Please consider d/c of Novolog Sensitive SSI Q4 hours and Initiation of ICU Glycemic Control Phase 1- IV Insulin     --Will follow patient during hospitalization--  Ambrose FinlandJeannine Johnston Malcome Ambrocio RN, MSN, CDE Diabetes Coordinator Inpatient Glycemic Control Team Team Pager: [phone] (8a-5p)

## 2017-03-07 NOTE — Progress Notes (Signed)
Pt's 0039 ABG reported to Dr. Bea LauraE Deterding

## 2017-03-07 NOTE — Progress Notes (Signed)
Progress Note  Patient Name: Andres Sanchez. Date of Encounter: 03/07/2017  Primary Cardiologist: New ([facility])  Subjective   Intubated and sedated. No family at bedside.  Inpatient Medications    Scheduled Meds: . calcium chloride  1 g Intravenous Once  . chlorhexidine gluconate (MEDLINE KIT)  15 mL Mouth Rinse BID  . dextrose  50 mL Intravenous Once  . fentaNYL (SUBLIMAZE) injection  50 mcg Intravenous Once  . hydrocortisone sod succinate (SOLU-CORTEF) inj  50 mg Intravenous Q6H  . mouth rinse  15 mL Mouth Rinse QID  . pantoprazole (PROTONIX) IV  40 mg Intravenous QHS   Continuous Infusions: . sodium chloride    . angiotensin II (GIAPREZA) infusion 40 ng/kg/min (03/07/17 0910)  . anidulafungin    . epinephrine 20 mcg/min (03/07/17 1130)  . fentaNYL infusion INTRAVENOUS 250 mcg/hr (03/07/17 0902)  . heparin    . insulin (NOVOLIN-R) infusion    . meropenem (MERREM) IV Stopped (03/07/17 7035)  . norepinephrine (LEVOPHED) Adult infusion 40 mcg/min (03/07/17 1130)  . dialysis replacement fluid (prismasate) 200 mL/hr at 03/07/17 1034  . dialysate (PRISMASATE) 1,500 mL/hr at 03/07/17 1032  . propofol (DIPRIVAN) infusion Stopped (03/19/2017 1023)  .  sodium bicarbonate  infusion 1000 mL 100 mL/hr at 03/07/17 0901  . sodium bicarbonate (isotonic) 1000 mL infusion 300 mL/hr at 03/07/17 0719  . sodium chloride    . vancomycin Stopped (03/07/17 0931)  . vasopressin (PITRESSIN) infusion - *FOR SHOCK* 0.03 Units/min (03/07/17 0714)   PRN Meds: sodium chloride, fentaNYL, heparin, midazolam, midazolam, sodium chloride   Vital Signs    Vitals:   03/07/17 0835 03/07/17 0900 03/07/17 1000 03/07/17 1100  BP: (!) 127/49 (!) 81/27 (!) 95/54   Pulse:  91 94 91  Resp:  16 (!) 26 (!) 24  Temp:  (!) 96.1 F (35.6 C) (!) 96.1 F (35.6 C) (!) 96.1 F (35.6 C)  TempSrc:      SpO2: 100% 100% 100% 98%  Weight:      Height:        Intake/Output Summary (Last 24 hours) at 03/07/17  1132 Last data filed at 03/07/17 1100  Gross per 24 hour  Intake          9243.95 ml  Output              901 ml  Net          8342.95 ml   Filed Weights   02/23/2017 0800 02/20/2017 0839 03/07/17 0458  Weight: 264 lb 8.8 oz (120 kg) 264 lb 8.8 oz (120 kg) (!) 308 lb 10.3 oz (140 kg)    Telemetry    NSr, rare PVcs and couplets - Personally Reviewed  ECG    No new tracings - Personally Reviewed  Physical Exam  Sedated and unresponsive, intubated GEN: No acute distress.   Neck: No JVD Cardiac: RRR, harsh late systolic murmur, no diastolic murmurs, rubs, or gallops.  Respiratory: Clear to auscultation bilaterally. GI: Soft, nontender, non-distended  MS: No edema; No deformity. Neuro:  unable to examine  Labs    Chemistry Recent Labs Lab 03/17/2017 0859 03/14/2017 1136 02/20/2017 1748 03/07/17 0452  NA 136 138 135 140  K >7.5* >7.5* >7.5* 4.0  CL 97* 99* 95* 91*  CO2 <7* <7* <7* 10*  GLUCOSE 115* 155* 241* 347*  BUN 75* 74* 73* 49*  CREATININE 12.40* 11.88* 11.32* 7.55*  CALCIUM 7.5* 7.3* 6.7* 5.9*  PROT 5.0* 5.1*  --  4.4*  ALBUMIN 2.5* 2.6* 2.4* 2.3*  AST 103* 137*  --  387*  ALT 50 58  --  107*  ALKPHOS 100 109  --  119  BILITOT 1.3* 1.7*  --  3.1*  GFRNONAA 4* 4* 5* 8*  GFRAA 5* 5* 5* 9*  ANIONGAP NOT CALCULATED NOT CALCULATED NOT CALCULATED 39*     Hematology Recent Labs Lab 03/10/2017 0859 02/22/2017 1136 03/07/17 0452  WBC 5.6 8.1 5.1  RBC 2.52* 2.74* 2.65*  HGB 9.2* 10.1* 9.3*  HCT 31.1* 32.9* 30.4*  MCV 123.4* 120.1* 114.7*  MCH 36.5* 36.9* 35.1*  MCHC 29.6* 30.7 30.6  RDW 12.8 13.1 12.7  PLT 89* 85* 65*    Cardiac Enzymes Recent Labs Lab 03/05/2017 1136 03/07/17 0952  TROPONINI 0.09* 15.90*    Recent Labs Lab 02/22/2017 0753  TROPIPOC 0.13*     BNPNo results for input(s): BNP, PROBNP in the last 168 hours.   DDimer No results for input(s): DDIMER in the last 168 hours.   Radiology    Ct Abdomen Pelvis Wo Contrast  Result Date:  03/04/2017 CLINICAL DATA:  Hypoxemia. Pt intubated. Unable to obtain hx from pt. Per RN pt was complaining of pain both sides. Pt has brown substance coming from OG tube. Pt has 15 lactic acid and 13.50 creatinine. Pt not on dialysis. EXAM: CT CHEST, ABDOMEN AND PELVIS WITHOUT CONTRAST TECHNIQUE: Multidetector CT imaging of the chest, abdomen and pelvis was performed following the standard protocol without IV contrast. COMPARISON:  None. FINDINGS: CT CHEST FINDINGS Cardiovascular: No significant vascular findings. Normal heart size. No pericardial effusion. Coronary artery atherosclerosis in the LAD. Mediastinum/Nodes: No enlarged mediastinal, hilar, or axillary lymph nodes. Thyroid gland, trachea, and esophagus demonstrate no significant findings. Lungs/Pleura: Small bilateral pleural effusions. Bilateral lower lobe airspace disease which may reflect atelectasis versus pneumonia. Musculoskeletal: No acute osseous abnormality. No aggressive lytic or sclerotic osseous lesion. Anterior bridging thoracic spine osteophytes as can be seen with diffuse idiopathic skeletal hyperostosis. CT ABDOMEN PELVIS FINDINGS Hepatobiliary: Low attenuation of the liver as can be seen with hepatic steatosis. Normal gallbladder. Pancreas: Unremarkable. No pancreatic ductal dilatation or surrounding inflammatory changes. Spleen: Normal in size without focal abnormality. Adrenals/Urinary Tract: Normal adrenal glands. Normal kidneys. Mild nonspecific bilateral perinephric stranding. No urolithiasis or obstructive uropathy. Decompressed bladder. Stomach/Bowel: No bowel dilatation or bowel wall thickening. Sigmoid colon and descending colon diverticulosis without evidence of diverticulitis. No pneumatosis, pneumoperitoneum or portal venous gas. Nasogastric tube in the stomach. Mild fat stranding in the mesenteries. Vascular/Lymphatic: Normal caliber abdominal aorta. No lymphadenopathy. Reproductive: Prostate is unremarkable. Other: No  abdominal wall hernia or abnormality. No abdominopelvic ascites. Musculoskeletal: No acute osseous abnormality. No lytic or sclerotic osseous lesion. Severe degenerative disc disease with disc height loss at L5-S1. Broad-based disc bulges at L4-5 and L5-S1. IMPRESSION: 1. Bilateral small pleural effusions. Bilateral lower lobe airspace disease which may reflect atelectasis versus pneumonia. 2. Diverticulosis without evidence of diverticulitis. 3. Hepatic steatosis. 4. Mild mesenteric haziness which may be secondary to mild edema versus mild mesenteritis. Electronically Signed   By: Kathreen Devoid   On: 03/05/2017 08:49   Ct Chest Wo Contrast  Result Date: 03/15/2017 CLINICAL DATA:  Hypoxemia. Pt intubated. Unable to obtain hx from pt. Per RN pt was complaining of pain both sides. Pt has brown substance coming from OG tube. Pt has 15 lactic acid and 13.50 creatinine. Pt not on dialysis. EXAM: CT CHEST, ABDOMEN AND PELVIS WITHOUT CONTRAST TECHNIQUE: Multidetector CT imaging of the chest,  abdomen and pelvis was performed following the standard protocol without IV contrast. COMPARISON:  None. FINDINGS: CT CHEST FINDINGS Cardiovascular: No significant vascular findings. Normal heart size. No pericardial effusion. Coronary artery atherosclerosis in the LAD. Mediastinum/Nodes: No enlarged mediastinal, hilar, or axillary lymph nodes. Thyroid gland, trachea, and esophagus demonstrate no significant findings. Lungs/Pleura: Small bilateral pleural effusions. Bilateral lower lobe airspace disease which may reflect atelectasis versus pneumonia. Musculoskeletal: No acute osseous abnormality. No aggressive lytic or sclerotic osseous lesion. Anterior bridging thoracic spine osteophytes as can be seen with diffuse idiopathic skeletal hyperostosis. CT ABDOMEN PELVIS FINDINGS Hepatobiliary: Low attenuation of the liver as can be seen with hepatic steatosis. Normal gallbladder. Pancreas: Unremarkable. No pancreatic ductal dilatation  or surrounding inflammatory changes. Spleen: Normal in size without focal abnormality. Adrenals/Urinary Tract: Normal adrenal glands. Normal kidneys. Mild nonspecific bilateral perinephric stranding. No urolithiasis or obstructive uropathy. Decompressed bladder. Stomach/Bowel: No bowel dilatation or bowel wall thickening. Sigmoid colon and descending colon diverticulosis without evidence of diverticulitis. No pneumatosis, pneumoperitoneum or portal venous gas. Nasogastric tube in the stomach. Mild fat stranding in the mesenteries. Vascular/Lymphatic: Normal caliber abdominal aorta. No lymphadenopathy. Reproductive: Prostate is unremarkable. Other: No abdominal wall hernia or abnormality. No abdominopelvic ascites. Musculoskeletal: No acute osseous abnormality. No lytic or sclerotic osseous lesion. Severe degenerative disc disease with disc height loss at L5-S1. Broad-based disc bulges at L4-5 and L5-S1. IMPRESSION: 1. Bilateral small pleural effusions. Bilateral lower lobe airspace disease which may reflect atelectasis versus pneumonia. 2. Diverticulosis without evidence of diverticulitis. 3. Hepatic steatosis. 4. Mild mesenteric haziness which may be secondary to mild edema versus mild mesenteritis. Electronically Signed   By: Kathreen Devoid   On: 02/25/2017 08:49   US Renal  Result Date: 03/12/2017 CLINICAL DATA:  Elevated creatinine EXAM: RENAL / URINARY TRACT ULTRASOUND COMPLETE COMPARISON:  None. FINDINGS: Right Kidney: Length: 14.1 cm. Echogenicity within normal limits. No mass or hydronephrosis visualized. Left Kidney: Length: 14.4 cm. Echogenicity within normal limits. No mass or hydronephrosis visualized. Bladder: Decompressed by Foley catheter. IMPRESSION: No acute abnormality noted. Electronically Signed   By: Inez Catalina M.D.   On: 02/23/2017 12:32   Dg Chest Port 1 View  Result Date: 02/20/2017 CLINICAL DATA:  Central line placement EXAM: PORTABLE CHEST 1 VIEW COMPARISON:  02/27/2017 FINDINGS:  Endotracheal tube tip at the clavicular heads. Left IJ central line with tip near the upper SVC. New right neck catheter with tip above the SVC level. An orogastric tube reaches the stomach at least. Worsening perihilar opacity, especially on the left. No evidence of pneumothorax. IMPRESSION: New central line on the right with tip over the brachiocephalic level. No evidence of pneumothorax. Perihilar opacities with interval worsening on the left. Electronically Signed   By: Monte Fantasia M.D.   On: 02/28/2017 16:00   Dg Chest Portable 1 View  Result Date: 03/05/2017 CLINICAL DATA:  Status post central line placement EXAM: PORTABLE CHEST 1 VIEW COMPARISON:  02/28/2017 FINDINGS: Endotracheal tube, nasogastric catheter and left jugular central line are not seen. The central line tip is noted in the proximal superior vena cava. No pneumothorax is seen. Stable changes are noted in the right mid lung. No new focal abnormality is seen. IMPRESSION: Status post central line placement as described. The remainder of the study is stable from the previous exam. Electronically Signed   By: Inez Catalina M.D.   On: 02/27/2017 10:18   Dg Chest Portable 1 View  Result Date: 02/24/2017 CLINICAL DATA:  Hypoxia EXAM: PORTABLE CHEST  1 VIEW COMPARISON:  December 31, 2016 FINDINGS: Endotracheal tube tip is 4.0 cm above the carina. Nasogastric tube tip and side port are in stomach. No pneumothorax. There is new atelectatic change in the mid lung. Lungs elsewhere are clear. Heart size and pulmonary vascularity are within normal limits. No adenopathy. No bone lesions. IMPRESSION: Tube positions as described without pneumothorax. Right midlung atelectasis. Lungs elsewhere clear. Heart size within normal limits. Electronically Signed   By: Lowella Grip III M.D.   On: 03/10/2017 08:05    Cardiac Studies   Bedside limited review of echo shows a hyperdynamic LV with subvalvular dynamic obstruction due to [facility] of the mitral  valve  Patient Profile     51 y.o. male with septic shock and severe metabolic abnormalities presented with severe bradycardia that has resolved after partial correction of the metabolic abnormalities. Remains critically ill, intubated and on high doses of multiple vasopressors and inotropes. Troponin has increased to 15.9  Assessment & Plan    1. Septic shock: critically ill, grave prognosis. Source uncertain. On multiple pressors and broad antibiotic coverage. 2. Subvalvular stenosis: "HOCM-like" physiology due to the hyperdynamic LV and use of inotropes and low afterload due to sepsis/hypotension. Prefer use of pure vasoconstrictors and lower amounts of positive inotropes. 3. Demand ischemia:  The elevation in troponin is expected for his degree of hypotension/ shock, although the degree of elevation is higher than typically seen. While I do not think this represents a true acute athero-thrombotic event, it may be related to a preexisting coronary stenosis with injury in the setting of extreme hemodynamic disturbance. Will review echo fully when completed. 4. Coronary calcification on CT: might be the site of a coronary stenosis. Evaluation will have to wait until he has recovered from multi system organ failure. 5. ARF on CRRT 6. Shock Liver: transaminases rising, bili worse, but PT better.  Signed, Sanda Klein, MD  03/07/2017, 11:32 AM  \

## 2017-03-07 NOTE — Progress Notes (Signed)
ANTICOAGULATION CONSULT NOTE  Pharmacy Consult for heparin Indication: chest pain/ACS  No Known Allergies  Patient Measurements: Height: 5\' 11"  (180.3 cm) Weight: (!) 308 lb 10.3 oz (140 kg) IBW/kg (Calculated) : 75.3 Heparin Dosing Weight: 108 kg  Vital Signs: Temp: 98.4 F (36.9 C) (05/18 1800) Temp Source: Core (Comment) (05/18 1600) BP: 91/54 (05/18 1900) Pulse Rate: 89 (05/18 1900)  Labs:  Recent Labs  02-25-17 0859 02-25-17 1136 02-25-17 1748 03/07/17 0452 03/07/17 0952 03/07/17 1638 03/07/17 1800  HGB 9.2* 10.1*  --  9.3*  --   --   --   HCT 31.1* 32.9*  --  30.4*  --   --   --   PLT 89* 85*  --  65*  --   --   --   APTT  --  56*  --   --   --   --   --   LABPROT  --  25.6*  --   --  21.1*  --   --   INR  --  2.29  --   --  1.79  --   --   HEPARINUNFRC  --   --   --   --   --   --  0.53  CREATININE 12.40* 11.88* 11.32* 7.55*  --  5.29*  --   TROPONINI  --  0.09*  --   --  15.90*  --   --     Estimated Creatinine Clearance: 24.4 mL/min (A) (by C-G formula based on SCr of 5.29 mg/dL (H)).  Assessment: CC/HPI: 51 yo m presenting with BL rib pain - developed generalized weakness, fever over weekend  On heparin infusion for positive troponin. Still critically ill. Initial heparin level is therapeutic. On CRRT while in ARF.    Goal of Therapy:  Heparin level 0.3-0.7 units/ml Monitor platelets by anticoagulation protocol: Yes   Plan:  -Continue heparin at 1500 units/hr -Daily HL, CBC    Agapito GamesAlison Evelynne Spiers, PharmD, BCPS Clinical Pharmacist 03/07/2017 7:10 PM

## 2017-03-07 NOTE — H&P (Signed)
PULMONARY / CRITICAL CARE MEDICINE   Name: Andres Sanchez. MRN: [id number] DOB: 07-17-1966    ADMISSION DATE:  03/19/2017   REFERRING MD:  EDP  CHIEF COMPLAINT: bilateral rib pain  HISTORY OF PRESENT ILLNESS:   51 yo WM with a history of Angina , morbid obesity who per his wife became ill over the the weekend with malaise , fever and pain bilateral ribs. He continued to decline and EMS was activated 5/17 and when EMS arrived he was awake , alert and cooperative. In route to hospital he became unresponsive, bradycardic and required external pacemaker. In ED he was hypothermic (18F)  bradycardic 58, hypotensive and was intubated by EDP and PCCM was consulted. His PH was 6.7, creatine 12.07, K+ 7.2. CVL and aline were placed, bicarb drip , abx  , vasopressors started. His prognosis appears grim.  SUBJECTIVE:  Levo 125, ph slow improvement, epi 20 Awake, follows commands  VITAL SIGNS: BP (!) 103/35   Pulse 86   Temp (!) 95.9 F (35.5 C)   Resp 20   Ht 5' 11"  (1.803 m)   Wt (!) 140 kg (308 lb 10.3 oz)   SpO2 100%   BMI 43.05 kg/m   HEMODYNAMICS: CVP:  [12 mmHg-15 mmHg] 12 mmHg  VENTILATOR SETTINGS: Vent Mode: PRVC FiO2 (%):  [50 %] 50 % Set Rate:  [32 bmp-35 bmp] 35 bmp Vt Set:  [600 mL] 600 mL PEEP:  [5 cmH20] 5 cmH20 Plateau Pressure:  [22 cmH20-26 cmH20] 26 cmH20  INTAKE / OUTPUT: I/O last 3 completed shifts: In: 7549.7 [I.V.:6159.7; IV Piggyback:1390] Out: -26   PHYSICAL EXAMINATION: General: awake, no distress Neuro: nonfocal, perrl, fc HEENT: jvd wnl, line wnl PULM: coarse bases CV: s1 s2 RRR no r/g GI: soft, BS low, no r/g, no pain reported Extremities: edema   LABS:  BMET  Recent Labs Lab 03/13/2017 1136 03/18/2017 1748 03/07/17 0452  NA 138 135 140  K >7.5* >7.5* 4.0  CL 99* 95* 91*  CO2 <7* <7* 10*  BUN 74* 73* 49*  CREATININE 11.88* 11.32* 7.55*  GLUCOSE 155* 241* 347*   Electrolytes  Recent Labs Lab 03/15/2017 1136 03/15/2017 1748  03/07/17 0452  CALCIUM 7.3* 6.7* 5.9*  MG 2.6*  --  1.9  PHOS 14.7* 16.2*  --    CBC  Recent Labs Lab 02/19/2017 0859 02/20/2017 1136 03/07/17 0452  WBC 5.6 8.1 5.1  HGB 9.2* 10.1* 9.3*  HCT 31.1* 32.9* 30.4*  PLT 89* 85* 65*   Coag's  Recent Labs Lab 03/15/2017 1136  APTT 56*  INR 2.29    Sepsis Markers  Recent Labs Lab 03/05/2017 0800 03/04/2017 1125 03/13/2017 1136 03/19/2017 2015  LATICACIDVEN 15.01* 15.7*  --  15.0*  PROCALCITON  --   --  0.39  --    ABG  Recent Labs Lab 03/05/2017 2230 03/07/17 0039 03/07/17 0425  PHART 6.966* 7.028* 7.129*  PCO2ART 27.4* 27.2* 29.8*  PO2ART 88.5 84.8 68.7*    Liver Enzymes  Recent Labs Lab 03/05/2017 0859 03/11/2017 1136 03/12/2017 1748 03/07/17 0452  AST 103* 137*  --  387*  ALT 50 58  --  107*  ALKPHOS 100 109  --  119  BILITOT 1.3* 1.7*  --  3.1*  ALBUMIN 2.5* 2.6* 2.4* 2.3*   Cardiac Enzymes  Recent Labs Lab 03/19/2017 1136  TROPONINI 0.09*    Glucose  Recent Labs Lab 02/28/2017 0753 03/10/2017 1929 03/13/2017 2104 03/14/2017 2344 03/07/17 0337 03/07/17 0735  GLUCAP 62*  244* 261* 303* 332* 368*    Imaging US Renal  Result Date: 03/01/2017 CLINICAL DATA:  Elevated creatinine EXAM: RENAL / URINARY TRACT ULTRASOUND COMPLETE COMPARISON:  None. FINDINGS: Right Kidney: Length: 14.1 cm. Echogenicity within normal limits. No mass or hydronephrosis visualized. Left Kidney: Length: 14.4 cm. Echogenicity within normal limits. No mass or hydronephrosis visualized. Bladder: Decompressed by Foley catheter. IMPRESSION: No acute abnormality noted. Electronically Signed   By: Inez Catalina M.D.   On: 03/20/2017 12:32   Dg Chest Port 1 View  Result Date: 03/11/2017 CLINICAL DATA:  Central line placement EXAM: PORTABLE CHEST 1 VIEW COMPARISON:  02/22/2017 FINDINGS: Endotracheal tube tip at the clavicular heads. Left IJ central line with tip near the upper SVC. New right neck catheter with tip above the SVC level. An orogastric tube  reaches the stomach at least. Worsening perihilar opacity, especially on the left. No evidence of pneumothorax. IMPRESSION: New central line on the right with tip over the brachiocephalic level. No evidence of pneumothorax. Perihilar opacities with interval worsening on the left. Electronically Signed   By: Monte Fantasia M.D.   On: 03/11/2017 16:00   Dg Chest Portable 1 View  Result Date: 02/26/2017 CLINICAL DATA:  Status post central line placement EXAM: PORTABLE CHEST 1 VIEW COMPARISON:  02/27/2017 FINDINGS: Endotracheal tube, nasogastric catheter and left jugular central line are not seen. The central line tip is noted in the proximal superior vena cava. No pneumothorax is seen. Stable changes are noted in the right mid lung. No new focal abnormality is seen. IMPRESSION: Status post central line placement as described. The remainder of the study is stable from the previous exam. Electronically Signed   By: Inez Catalina M.D.   On: 03/16/2017 10:18     STUDIES:  5/17 2 d >> 5/17 renal US>>neg  CULTURES: 5/17 sputum>> 5/17 bc x 2>> 5/17 uc>>   ANTIBIOTICS: 5/17 vanc>> 5/17 cefepime>>5/17 5/17 meropenem>>> 5/17 eraxis>>>  SIGNIFICANT EVENTS: 5/17 admit in severe septic shock, MODS, cvvhd  LINES/TUBES: 5/17 ET>> 5/17 LIJVCL>> 5/17 Rt Fem Aline>>  ASSESSMENT / PLAN:  PULMONARY A: VDRF secondary to sepsis/shock Uncompensated met acidosis ATX P:   ABg repeat reviewed, keep rate 35 Bicarb rate to lower consider as will increase co2 soon pcxr in am  No SBT  CARDIOVASCULAR A:  Shock/sepsis- refractory Hx on angina  Bradycardia resolved P:  Abx Fluid resuscitation, consider lower, last 24 hours 7 liters up Serial troponin repeat now Levophed 125,. Epi 20 - maxed Vaso Add ANG 2 as appearing now survivable - update: after start already down to levophed 50 in minutes Would adjust stress steroids to 50 q6h, cortisol 25 , technically adequate but not for this degree of  shock Place noninvasive monitor, or swan, echo now  RENAL A:   Acute renal failure Hyperkalemia Metabolic acidosis  P:   cvvhd Even to slight pos balance goals  GASTROINTESTINAL A:   GI protection Possible abd source of infection P:   PPI No feeding , maybe in am depending on pressor needs LFt repeat in am  Consider ORAL contrast CT if able to stabilize pressor needs  HEMATOLOGIC A:   Anemia Thrombocytopenia (sepsis, dilution) P:  Transfuse per protocol Sub q heparin while on ang 2 Check coags now and in am   INFECTIOUS A:   Presumed infectious without obvious source abdo? P:   Keep vanc, mero, empiric eraxis Follow BC CT abdo oral contrast if able  ENDOCRINE A:   Monitor glucose ?  Adrenal insuff  P:   CBG Stress steroids to 50 q6h No florinef with arf  NEUROLOGIC A:   Obtunded from presumed metabolic encephalopathy - improved with improve dph P:   RASS goal: -1 fent drip, no WUA   FAMILY  - Updates: updated wife at bedside  - Inter-disciplinary family meet or Palliative Care meeting due by:  day 7   Ccm time 50 min   Lavon Paganini. Titus Mould, MD, [place] Pgr: [facility]

## 2017-03-07 NOTE — Progress Notes (Signed)
ANTICOAGULATION CONSULT NOTE - Initial Consult  Pharmacy Consult for heparin Indication: chest pain/ACS  No Known Allergies  Patient Measurements: Height: 5\' 11"  (180.3 cm) Weight: (!) 308 lb 10.3 oz (140 kg) IBW/kg (Calculated) : 75.3 Heparin Dosing Weight: 108 kg  Vital Signs: Temp: 96.1 F (35.6 C) (05/18 1100) Temp Source: Oral (05/18 0338) BP: 95/54 (05/18 1000) Pulse Rate: 91 (05/18 1100)  Labs:  Recent Labs  07-09-17 0859 07-09-17 1136 07-09-17 1748 03/07/17 0452 03/07/17 0952  HGB 9.2* 10.1*  --  9.3*  --   HCT 31.1* 32.9*  --  30.4*  --   PLT 89* 85*  --  65*  --   APTT  --  56*  --   --   --   LABPROT  --  25.6*  --   --  21.1*  INR  --  2.29  --   --  1.79  CREATININE 12.40* 11.88* 11.32* 7.55*  --   TROPONINI  --  0.09*  --   --  15.90*    Estimated Creatinine Clearance: 17.1 mL/min (A) (by C-G formula based on SCr of 7.55 mg/dL (H)).  Assessment: CC/HPI: 51 yo m presenting with BL rib pain - developed generalized weakness, fever over weekend  PMH: angina  Anticoag: none pta; on sq hep To transition to iv hep d/t elevated trop  Heme/Onc: H&H 9.3/30.4, Plt 65 INR 2.29>>1.79  Goal of Therapy:  Heparin level 0.3-0.7 units/ml Monitor platelets by anticoagulation protocol: Yes   Plan:  Dc sq hep Add hep gtt no bolus d/t sq hep Heparin 1500 units/hr Initial HL 1800 Daily HL, CBC  Isaac BlissMichael Goble Fudala, PharmD, BCPS, BCCCP Clinical Pharmacist Clinical phone for 03/07/2017 from 7a-3:30p: [phone] If after 3:30p, please call main pharmacy at: [phone] 03/07/2017 11:18 AM

## 2017-03-07 NOTE — Progress Notes (Signed)
CRITICAL VALUE ALERT  Critical value received:  Troponin 15.1  Date of notification:  03/07/17  Time of notification:  1058  Critical value read back:Yes.    Nurse who received alert:  Lorin PicketLindsey Ceferino Lang  MD notified (1st page):  Tyson AliasFeinstein  Time of first page:  1108  MD notified (2nd page):  Time of second page:  Responding MD:  Tyson AliasFeinstein  Time MD responded:  [phone]

## 2017-03-07 NOTE — Progress Notes (Signed)
  Echocardiogram 2D Echocardiogram has been performed.  Andres Sanchez T Kamill Fulbright 03/07/2017, 11:47 AM

## 2017-03-07 NOTE — Progress Notes (Signed)
Initial Nutrition Assessment  DOCUMENTATION CODES:   Morbid obesity  INTERVENTION:   When able to begin TF, recommend:  Vital High Protein at 65 ml/h (1560 ml per day)  Pro-stat 30 ml QID  Provides 1960 kcal, 197 gm protein, 1304 ml free water daily  NUTRITION DIAGNOSIS:   Inadequate oral intake related to inability to eat as evidenced by NPO status.  GOAL:   Provide needs based on ASPEN/SCCM guidelines  MONITOR:   Vent status, Labs, I & O's  REASON FOR ASSESSMENT:   Ventilator    ASSESSMENT:   51 yo WM with a history of angina, HTN, DM, tachycardia, and morbid obesity who per his wife became ill over the the weekend with malaise, fever, and bilateral rib pain. He continued to decline and EMS was activated 5/17. In route to hospital he became unresponsive, bradycardic and required external pacemaker. In ED he was hypothermic (74F)  bradycardic 58, hypotensive, and was intubated.    Discussed patient in ICU rounds and with RN today. Prognosis is very poor. Currently receiving CRRT.  No plans to start nutrition today, may be able to start nutrition tomorrow depending on pressor needs. Concern for abdominal source of infection. Currently on Angiotensin II, epinephrine, vasopressin, and norepinephrine drips.  Unable to complete Nutrition-Focused physical exam at this time.  Patient is currently intubated on ventilator support Temp (24hrs), Avg:95.7 F (35.4 C), Min:93 F (33.9 C), Max:97.5 F (36.4 C)  Propofol: none Labs reviewed: potassium >7.5 (H), phosphorus 16.2 (H) CBG's: 332-368 Medications reviewed and include vancomycin, meropenem.  Diet Order:  Diet NPO time specified  Skin:  Reviewed, no issues  Last BM:  PTA  Height:   Ht Readings from Last 1 Encounters:  03/09/2017 5\' 11"  (1.803 m)    Weight:   Wt Readings from Last 1 Encounters:  03/07/17 (!) 308 lb 10.3 oz (140 kg)    Ideal Body Weight:  78.2 kg  BMI:  Body mass index is 43.05  kg/m.  Estimated Nutritional Needs:   Kcal:  1540-1960  Protein:  195 gm  Fluid:  2 L  EDUCATION NEEDS:   No education needs identified at this time  Joaquin CourtsKimberly Nazaire Cordial, RD, LDN, CNSC Pager [phone] After Hours Pager [phone]

## 2017-03-07 NOTE — Progress Notes (Signed)
eLink Physician-Brief Progress Note Patient Name: Andres RileyJohn R Eissler Sanchez. DOB: 01/14/1966 MRN: [id number]   Date of Service  03/07/2017  HPI/Events of Note  Severe lactic acidosis with pH 7.129/29/68/9.8.  On CRRT with bicarb and bicarb gtt.  PH was slowly improved  eICU Interventions  4 amps of bicarb IVP Increase bicarb gtt to 200 cc/hr     Intervention Category Major Interventions: Acid-Base disturbance - evaluation and management  DETERDING,ELIZABETH 03/07/2017, 4:55 AM

## 2017-03-07 NOTE — Care Management Note (Signed)
Case Management Note  Patient Details  Name: Andres RileyJohn R Fringer Jr. MRN: [id number] Date of Birth: [birth year]  Subjective/Objective:       Pt admitted with bilateral rib pain, eventually had to be intubated VDRF secondary to sepsis/shock- very poor prognosis            Action/Plan:  PTA from home with wife   Expected Discharge Date:                  Expected Discharge Plan:  Home/Self Care  In-House Referral:     Discharge planning Services  CM Consult  Post Acute Care Choice:    Choice offered to:     DME Arranged:    DME Agency:     HH Arranged:    HH Agency:     Status of Service:  In process, will continue to follow  If discussed at Long Length of Stay Meetings, dates discussed:    Additional Comments:  Cherylann ParrClaxton, Bria Sparr S, RN 03/07/2017, 10:54 AM

## 2017-03-07 NOTE — Progress Notes (Signed)
eLink Physician-Brief Progress Note Patient Name: Andres RileyJohn R Saez Jr. DOB: 02-08-1966 MRN: [id number]   Date of Service  03/07/2017  HPI/Events of Note  Ca++ =  5.5 and Albumin = 2.3. Ca++ corrects to 6.86.  eICU Interventions  Will replace Ca++.      Intervention Category Major Interventions: Electrolyte abnormality - evaluation and management  Maureen Duesing Eugene 03/07/2017, 5:40 PM

## 2017-03-07 NOTE — Progress Notes (Signed)
eLink Physician-Brief Progress Note Patient Name: Andres RileyJohn R Janoski Jr. DOB: [birth year] MRN: [id number]   Date of Service  03/07/2017  HPI/Events of Note  Lactic Acid = 17.4. CVP = 12.   eICU Interventions  Will bolus with 0.9 NaCl 1 liter IV over 1 hour now.     Intervention Category Major Interventions: Acid-Base disturbance - evaluation and management  Chamia Schmutz Eugene 03/07/2017, 5:48 PM

## 2017-03-07 NOTE — Progress Notes (Signed)
CRITICAL VALUE ALERT  Critical value received:  Lactic 21  Date of notification:  03/07/17  Time of notification:  1130  Critical value read back:Yes.    Nurse who received alert:  Julio AlmStephanie T  MD notified (1st page):  Tyson AliasFeinstein  Time of first page:  1230  MD notified (2nd page):  Time of second page:  Responding MD:  Tyson AliasFeinstein  Time MD responded:  1230

## 2017-03-07 NOTE — Progress Notes (Signed)
CRITICAL VALUE ALERT  Critical value received:  Calcium 5.5  Date of notification:  03/07/17  Time of notification:  1730  Critical value read back:Yes.    Nurse who received alert:  Lorin PicketLindsey Channel Papandrea  MD notified (1st page):  Sommer  Time of first page:  1738  MD notified (2nd page):  Time of second page:  Responding MD:  Arsenio LoaderSommer  Time MD responded:  [phone]

## 2017-03-07 NOTE — Progress Notes (Signed)
eLink Physician-Brief Progress Note Patient Name: Andres RileyJohn R Town Jr. DOB: [birth year] MRN: [id number]   Date of Service  03/07/2017  HPI/Events of Note  Hypocalcemia  eICU Interventions  Calcium replaced     Intervention Category Intermediate Interventions: Electrolyte abnormality - evaluation and management  DETERDING,ELIZABETH 03/07/2017, 5:50 AM

## 2017-03-07 NOTE — Progress Notes (Signed)
Subjective:  In ICU overnight- on 3 pressors with low BP- pH remained under 7 for quite a while- lactate 15- not been rechecked this AM- getting ready to start angiotensin II- amazing but pt is alert   Objective Vital signs in last 24 hours: Vitals:   03/07/17 0458 03/07/17 0600 03/07/17 0700 03/07/17 0800  BP:  (!) 90/42 (!) 82/24 (!) 103/35  Pulse:  83 83 86  Resp:  18 18 20   Temp:  (!) 96.1 F (35.6 C) (!) 96.1 F (35.6 C) (!) 95.9 F (35.5 C)  TempSrc:      SpO2:  100% 100% 100%  Weight: (!) 140 kg (308 lb 10.3 oz)     Height:       Weight change:   Intake/Output Summary (Last 24 hours) at 03/07/17 8590 Last data filed at 03/07/17 0801  Gross per 24 hour  Intake           8126.5 ml  Output               26 ml  Net           8100.5 ml    Assessment/ Plan: Pt is a 51 y.o. yo male DM/HTN/ETOH who was admitted on 03/07/2017 with likely septic shock- severe metabolic lactic acidosis and hemodynamic instability- c/b AKI Assessment/Plan: 1. Renal- AKI in the setting of severe hypotension.  No UOP- was on ACE as OP, no obstruction noted on imaging- currently being supported with CRRT-  2. [place]- K is better- adjust  - adjust dialysate 3. Anemia- not to a critical point just yet- supportive care 4. HTN/volume- CVP recorded at 12- not taking anything off with CRRT- but getting so much in- likely ending up positive which is OK for now 5. VDRF- per CCM 6. Sepsis- likely - on broad spectrum abx and multiple pressors- to start angiotensin II 7. Acidosis- lactate- on bicarb replacement fluid and also extra bicarb- pH just above 7   Paullette Mckain A    Labs: Basic Metabolic Panel:  Recent Labs Lab 03/10/2017 1136 03/17/2017 1748 03/07/17 0452  NA 138 135 140  K >7.5* >7.5* 4.0  CL 99* 95* 91*  CO2 <7* <7* 10*  GLUCOSE 155* 241* 347*  BUN 74* 73* 49*  CREATININE 11.88* 11.32* 7.55*  CALCIUM 7.3* 6.7* 5.9*  PHOS 14.7* 16.2*  --    Liver Function Tests:  Recent  Labs Lab 03/11/2017 0859 03/09/2017 1136 02/28/2017 1748 03/07/17 0452  AST 103* 137*  --  387*  ALT 50 58  --  107*  ALKPHOS 100 109  --  119  BILITOT 1.3* 1.7*  --  3.1*  PROT 5.0* 5.1*  --  4.4*  ALBUMIN 2.5* 2.6* 2.4* 2.3*    Recent Labs Lab 02/19/2017 1136 02/23/2017 2017  LIPASE 755* 770*  AMYLASE 527*  --    No results for input(s): AMMONIA in the last 168 hours. CBC:  Recent Labs Lab 03/05/2017 0859 02/23/2017 1136 03/07/17 0452  WBC 5.6 8.1 5.1  NEUTROABS 3.5  --   --   HGB 9.2* 10.1* 9.3*  HCT 31.1* 32.9* 30.4*  MCV 123.4* 120.1* 114.7*  PLT 89* 85* 65*   Cardiac Enzymes:  Recent Labs Lab 03/11/2017 1136  TROPONINI 0.09*   CBG:  Recent Labs Lab 02/27/2017 1929 02/18/2017 2104 02/28/2017 2344 03/07/17 0337 03/07/17 0735  GLUCAP 244* 261* 303* 332* 368*    Iron Studies: No results for input(s): IRON, TIBC, TRANSFERRIN, FERRITIN in the last  72 hours. Studies/Results: Ct Abdomen Pelvis Wo Contrast  Result Date: 03/02/2017 CLINICAL DATA:  Hypoxemia. Pt intubated. Unable to obtain hx from pt. Per RN pt was complaining of pain both sides. Pt has brown substance coming from OG tube. Pt has 15 lactic acid and 13.50 creatinine. Pt not on dialysis. EXAM: CT CHEST, ABDOMEN AND PELVIS WITHOUT CONTRAST TECHNIQUE: Multidetector CT imaging of the chest, abdomen and pelvis was performed following the standard protocol without IV contrast. COMPARISON:  None. FINDINGS: CT CHEST FINDINGS Cardiovascular: No significant vascular findings. Normal heart size. No pericardial effusion. Coronary artery atherosclerosis in the LAD. Mediastinum/Nodes: No enlarged mediastinal, hilar, or axillary lymph nodes. Thyroid gland, trachea, and esophagus demonstrate no significant findings. Lungs/Pleura: Small bilateral pleural effusions. Bilateral lower lobe airspace disease which may reflect atelectasis versus pneumonia. Musculoskeletal: No acute osseous abnormality. No aggressive lytic or sclerotic osseous  lesion. Anterior bridging thoracic spine osteophytes as can be seen with diffuse idiopathic skeletal hyperostosis. CT ABDOMEN PELVIS FINDINGS Hepatobiliary: Low attenuation of the liver as can be seen with hepatic steatosis. Normal gallbladder. Pancreas: Unremarkable. No pancreatic ductal dilatation or surrounding inflammatory changes. Spleen: Normal in size without focal abnormality. Adrenals/Urinary Tract: Normal adrenal glands. Normal kidneys. Mild nonspecific bilateral perinephric stranding. No urolithiasis or obstructive uropathy. Decompressed bladder. Stomach/Bowel: No bowel dilatation or bowel wall thickening. Sigmoid colon and descending colon diverticulosis without evidence of diverticulitis. No pneumatosis, pneumoperitoneum or portal venous gas. Nasogastric tube in the stomach. Mild fat stranding in the mesenteries. Vascular/Lymphatic: Normal caliber abdominal aorta. No lymphadenopathy. Reproductive: Prostate is unremarkable. Other: No abdominal wall hernia or abnormality. No abdominopelvic ascites. Musculoskeletal: No acute osseous abnormality. No lytic or sclerotic osseous lesion. Severe degenerative disc disease with disc height loss at L5-S1. Broad-based disc bulges at L4-5 and L5-S1. IMPRESSION: 1. Bilateral small pleural effusions. Bilateral lower lobe airspace disease which may reflect atelectasis versus pneumonia. 2. Diverticulosis without evidence of diverticulitis. 3. Hepatic steatosis. 4. Mild mesenteric haziness which may be secondary to mild edema versus mild mesenteritis. Electronically Signed   By: Kathreen Devoid   On: 03/13/2017 08:49   Ct Chest Wo Contrast  Result Date: 03/03/2017 CLINICAL DATA:  Hypoxemia. Pt intubated. Unable to obtain hx from pt. Per RN pt was complaining of pain both sides. Pt has brown substance coming from OG tube. Pt has 15 lactic acid and 13.50 creatinine. Pt not on dialysis. EXAM: CT CHEST, ABDOMEN AND PELVIS WITHOUT CONTRAST TECHNIQUE: Multidetector CT imaging  of the chest, abdomen and pelvis was performed following the standard protocol without IV contrast. COMPARISON:  None. FINDINGS: CT CHEST FINDINGS Cardiovascular: No significant vascular findings. Normal heart size. No pericardial effusion. Coronary artery atherosclerosis in the LAD. Mediastinum/Nodes: No enlarged mediastinal, hilar, or axillary lymph nodes. Thyroid gland, trachea, and esophagus demonstrate no significant findings. Lungs/Pleura: Small bilateral pleural effusions. Bilateral lower lobe airspace disease which may reflect atelectasis versus pneumonia. Musculoskeletal: No acute osseous abnormality. No aggressive lytic or sclerotic osseous lesion. Anterior bridging thoracic spine osteophytes as can be seen with diffuse idiopathic skeletal hyperostosis. CT ABDOMEN PELVIS FINDINGS Hepatobiliary: Low attenuation of the liver as can be seen with hepatic steatosis. Normal gallbladder. Pancreas: Unremarkable. No pancreatic ductal dilatation or surrounding inflammatory changes. Spleen: Normal in size without focal abnormality. Adrenals/Urinary Tract: Normal adrenal glands. Normal kidneys. Mild nonspecific bilateral perinephric stranding. No urolithiasis or obstructive uropathy. Decompressed bladder. Stomach/Bowel: No bowel dilatation or bowel wall thickening. Sigmoid colon and descending colon diverticulosis without evidence of diverticulitis. No pneumatosis, pneumoperitoneum or portal venous  gas. Nasogastric tube in the stomach. Mild fat stranding in the mesenteries. Vascular/Lymphatic: Normal caliber abdominal aorta. No lymphadenopathy. Reproductive: Prostate is unremarkable. Other: No abdominal wall hernia or abnormality. No abdominopelvic ascites. Musculoskeletal: No acute osseous abnormality. No lytic or sclerotic osseous lesion. Severe degenerative disc disease with disc height loss at L5-S1. Broad-based disc bulges at L4-5 and L5-S1. IMPRESSION: 1. Bilateral small pleural effusions. Bilateral lower lobe  airspace disease which may reflect atelectasis versus pneumonia. 2. Diverticulosis without evidence of diverticulitis. 3. Hepatic steatosis. 4. Mild mesenteric haziness which may be secondary to mild edema versus mild mesenteritis. Electronically Signed   By: Kathreen Devoid   On: 03/05/2017 08:49   US Renal  Result Date: 03/13/2017 CLINICAL DATA:  Elevated creatinine EXAM: RENAL / URINARY TRACT ULTRASOUND COMPLETE COMPARISON:  None. FINDINGS: Right Kidney: Length: 14.1 cm. Echogenicity within normal limits. No mass or hydronephrosis visualized. Left Kidney: Length: 14.4 cm. Echogenicity within normal limits. No mass or hydronephrosis visualized. Bladder: Decompressed by Foley catheter. IMPRESSION: No acute abnormality noted. Electronically Signed   By: Inez Catalina M.D.   On: 03/19/2017 12:32   Dg Chest Port 1 View  Result Date: 03/09/2017 CLINICAL DATA:  Central line placement EXAM: PORTABLE CHEST 1 VIEW COMPARISON:  03/18/2017 FINDINGS: Endotracheal tube tip at the clavicular heads. Left IJ central line with tip near the upper SVC. New right neck catheter with tip above the SVC level. An orogastric tube reaches the stomach at least. Worsening perihilar opacity, especially on the left. No evidence of pneumothorax. IMPRESSION: New central line on the right with tip over the brachiocephalic level. No evidence of pneumothorax. Perihilar opacities with interval worsening on the left. Electronically Signed   By: Monte Fantasia M.D.   On: 03/14/2017 16:00   Dg Chest Portable 1 View  Result Date: 03/02/2017 CLINICAL DATA:  Status post central line placement EXAM: PORTABLE CHEST 1 VIEW COMPARISON:  03/13/2017 FINDINGS: Endotracheal tube, nasogastric catheter and left jugular central line are not seen. The central line tip is noted in the proximal superior vena cava. No pneumothorax is seen. Stable changes are noted in the right mid lung. No new focal abnormality is seen. IMPRESSION: Status post central line  placement as described. The remainder of the study is stable from the previous exam. Electronically Signed   By: Inez Catalina M.D.   On: 03/02/2017 10:18   Dg Chest Portable 1 View  Result Date: 02/19/2017 CLINICAL DATA:  Hypoxia EXAM: PORTABLE CHEST 1 VIEW COMPARISON:  December 31, 2016 FINDINGS: Endotracheal tube tip is 4.0 cm above the carina. Nasogastric tube tip and side port are in stomach. No pneumothorax. There is new atelectatic change in the mid lung. Lungs elsewhere are clear. Heart size and pulmonary vascularity are within normal limits. No adenopathy. No bone lesions. IMPRESSION: Tube positions as described without pneumothorax. Right midlung atelectasis. Lungs elsewhere clear. Heart size within normal limits. Electronically Signed   By: Lowella Grip III M.D.   On: 03/17/2017 08:05   Medications: Infusions: . sodium chloride    . anidulafungin    . epinephrine 20 mcg/min (03/07/17 0728)  . fentaNYL infusion INTRAVENOUS 100 mcg/hr (03/07/17 0746)  . meropenem (MERREM) IV Stopped (03/07/17 4315)  . norepinephrine (LEVOPHED) Adult infusion 125 mcg/min (03/07/17 0714)  . dialysis replacement fluid (prismasate) 200 mL/hr at 03/07/17 0625  . dialysate (PRISMASATE) 2,000 mL/hr at 03/07/17 0113  . propofol (DIPRIVAN) infusion Stopped (03/07/2017 1023)  .  sodium bicarbonate  infusion 1000 mL 200 mL/hr  at 03/07/17 0715  . sodium bicarbonate (isotonic) 1000 mL infusion 300 mL/hr at 03/07/17 0719  . sodium chloride    . vancomycin 1,250 mg (03/07/17 0801)  . vasopressin (PITRESSIN) infusion - *FOR SHOCK* 0.03 Units/min (03/07/17 0714)    Scheduled Medications: . atropine      . calcium chloride  1 g Intravenous Once  . chlorhexidine gluconate (MEDLINE KIT)  15 mL Mouth Rinse BID  . dextrose  50 mL Intravenous Once  . fentaNYL (SUBLIMAZE) injection  50 mcg Intravenous Once  . heparin subcutaneous  5,000 Units Subcutaneous Q8H  . hydrocortisone sod succinate (SOLU-CORTEF) inj  100 mg  Intravenous Q8H  . insulin aspart  0-9 Units Subcutaneous Q4H  . mouth rinse  15 mL Mouth Rinse QID  . pantoprazole (PROTONIX) IV  40 mg Intravenous QHS    have reviewed scheduled and prn medications.  Physical Exam: General: Amazing that he is alert and followed a command Heart:RRR Lungs: CBS bilat Abdomen: distended but non tender Extremities: minimal edema Dialysis Access: right HD vascath     03/07/2017,8:12 AM  LOS: 1 day

## 2017-03-07 NOTE — Progress Notes (Signed)
eLink Physician-Brief Progress Note Patient Name: Andres RileyJohn R Arther Jr. DOB: 1966/02/16 MRN: [id number]   Date of Service  03/07/2017  HPI/Events of Note  Patient on Heparin IV infusion for elevated troponin. Nurse reports bleeding from around CVL, hematuria and blood in BM.  eICU Interventions  Will order: 1. H/H now and Q 6 hours.   Will need to consider stopping Heparin IV infusion if his Hgb has dropped.      Intervention Category Major Interventions: Hemorrhage - evaluation and management  Sommer,Steven Dennard Nipugene 03/07/2017, 8:27 PM

## 2017-03-08 ENCOUNTER — Inpatient Hospital Stay (HOSPITAL_COMMUNITY): Payer: 59

## 2017-03-08 LAB — BLOOD CULTURE ID PANEL (REFLEXED)
ACINETOBACTER BAUMANNII: NOT DETECTED
CANDIDA ALBICANS: NOT DETECTED
CANDIDA GLABRATA: NOT DETECTED
CANDIDA KRUSEI: NOT DETECTED
CANDIDA PARAPSILOSIS: NOT DETECTED
CANDIDA TROPICALIS: NOT DETECTED
ENTEROBACTERIACEAE SPECIES: NOT DETECTED
Enterobacter cloacae complex: NOT DETECTED
Enterococcus species: NOT DETECTED
Escherichia coli: NOT DETECTED
Haemophilus influenzae: NOT DETECTED
KLEBSIELLA OXYTOCA: NOT DETECTED
KLEBSIELLA PNEUMONIAE: NOT DETECTED
Listeria monocytogenes: NOT DETECTED
Neisseria meningitidis: NOT DETECTED
PROTEUS SPECIES: NOT DETECTED
Pseudomonas aeruginosa: NOT DETECTED
Serratia marcescens: NOT DETECTED
Staphylococcus aureus (BCID): NOT DETECTED
Staphylococcus species: NOT DETECTED
Streptococcus agalactiae: NOT DETECTED
Streptococcus pneumoniae: NOT DETECTED
Streptococcus pyogenes: NOT DETECTED
Streptococcus species: NOT DETECTED

## 2017-03-08 LAB — CBC WITH DIFFERENTIAL/PLATELET
Basophils Absolute: 0 10*3/uL (ref 0.0–0.1)
Basophils Relative: 0 %
EOS PCT: 0 %
Eosinophils Absolute: 0 10*3/uL (ref 0.0–0.7)
HEMATOCRIT: 26.5 % — AB (ref 39.0–52.0)
HEMOGLOBIN: 8.7 g/dL — AB (ref 13.0–17.0)
LYMPHS ABS: 0.9 10*3/uL (ref 0.7–4.0)
Lymphocytes Relative: 7 %
MCH: 35.7 pg — ABNORMAL HIGH (ref 26.0–34.0)
MCHC: 32.8 g/dL (ref 30.0–36.0)
MCV: 108.6 fL — ABNORMAL HIGH (ref 78.0–100.0)
MONOS PCT: 10 %
Monocytes Absolute: 1.3 10*3/uL — ABNORMAL HIGH (ref 0.1–1.0)
NEUTROS ABS: 10.9 10*3/uL — AB (ref 1.7–7.7)
Neutrophils Relative %: 83 %
Platelets: 51 10*3/uL — ABNORMAL LOW (ref 150–400)
RBC: 2.44 MIL/uL — ABNORMAL LOW (ref 4.22–5.81)
RDW: 13 % (ref 11.5–15.5)
WBC MORPHOLOGY: INCREASED
WBC: 13.1 10*3/uL — ABNORMAL HIGH (ref 4.0–10.5)

## 2017-03-08 LAB — COMPREHENSIVE METABOLIC PANEL
ALT: 136 U/L — AB (ref 17–63)
AST: 677 U/L — AB (ref 15–41)
Albumin: 2.3 g/dL — ABNORMAL LOW (ref 3.5–5.0)
Alkaline Phosphatase: 121 U/L (ref 38–126)
Anion gap: 31 — ABNORMAL HIGH (ref 5–15)
BILIRUBIN TOTAL: 3.2 mg/dL — AB (ref 0.3–1.2)
BUN: 30 mg/dL — AB (ref 6–20)
CHLORIDE: 90 mmol/L — AB (ref 101–111)
CO2: 17 mmol/L — ABNORMAL LOW (ref 22–32)
CREATININE: 4.29 mg/dL — AB (ref 0.61–1.24)
Calcium: 5.6 mg/dL — CL (ref 8.9–10.3)
GFR calc Af Amer: 17 mL/min — ABNORMAL LOW (ref >60)
GFR calc non Af Amer: 15 mL/min — ABNORMAL LOW (ref >60)
GLUCOSE: 150 mg/dL — AB (ref 65–99)
POTASSIUM: 4 mmol/L (ref 3.5–5.1)
Sodium: 138 mmol/L (ref 135–145)
Total Protein: 4.4 g/dL — ABNORMAL LOW (ref 6.5–8.1)

## 2017-03-08 LAB — GLUCOSE, CAPILLARY
Glucose-Capillary: 119 mg/dL — ABNORMAL HIGH (ref 65–99)
Glucose-Capillary: 140 mg/dL — ABNORMAL HIGH (ref 65–99)
Glucose-Capillary: 155 mg/dL — ABNORMAL HIGH (ref 65–99)

## 2017-03-08 LAB — HEMOGLOBIN AND HEMATOCRIT, BLOOD
HEMATOCRIT: 22.2 % — AB (ref 39.0–52.0)
HEMOGLOBIN: 7.4 g/dL — AB (ref 13.0–17.0)

## 2017-03-08 LAB — PHOSPHORUS: Phosphorus: 3.5 mg/dL (ref 2.5–4.6)

## 2017-03-08 LAB — PROTIME-INR
INR: 3.16
PROTHROMBIN TIME: 33.1 s — AB (ref 11.4–15.2)

## 2017-03-08 LAB — HEPARIN LEVEL (UNFRACTIONATED): Heparin Unfractionated: 0.86 IU/mL — ABNORMAL HIGH (ref 0.30–0.70)

## 2017-03-08 LAB — MAGNESIUM: Magnesium: 1.5 mg/dL — ABNORMAL LOW (ref 1.7–2.4)

## 2017-03-08 MED ORDER — MAGNESIUM SULFATE 4 GM/100ML IV SOLN
4.0000 g | Freq: Once | INTRAVENOUS | Status: AC
  Administered 2017-03-08: 4 g via INTRAVENOUS
  Filled 2017-03-08: qty 100

## 2017-03-08 MED ORDER — HEPARIN SODIUM (PORCINE) 5000 UNIT/ML IJ SOLN: 5000.0000 [IU] | Freq: Three times a day (TID) | INTRAMUSCULAR | Status: DC

## 2017-03-08 MED ORDER — SODIUM CHLORIDE 0.9 % IV SOLN
2.0000 g | Freq: Once | INTRAVENOUS | Status: AC
  Administered 2017-03-08: 2 g via INTRAVENOUS
  Filled 2017-03-08: qty 20

## 2017-03-09 LAB — CULTURE, BLOOD (ROUTINE X 2): SPECIAL REQUESTS: ADEQUATE

## 2017-03-09 LAB — MRSA CULTURE: Culture: NOT DETECTED

## 2017-03-09 MED FILL — Medication: Qty: 1 | Status: AC

## 2017-03-11 LAB — CULTURE, BLOOD (ROUTINE X 2)
Culture: NO GROWTH
SPECIAL REQUESTS: ADEQUATE

## 2017-03-13 ENCOUNTER — Telehealth: Payer: Self-pay

## 2017-03-13 NOTE — Telephone Encounter (Signed)
On 03/13/17 I received a death certificate from [facility] (original). The death certificate is for burial. The patient is a patient of Doctor Tyson AliasFeinstein. [facility] this am for signature.  On 03/13/17 I received the death certificate back from Doctor Tyson AliasFeinstein. I got the death certificate ready and called the funeral home to let them know the d/c will be mailed to the health dept per the funeral home request.

## 2017-03-21 NOTE — Progress Notes (Signed)
RN called d/t pt expiring and requested extubation- confirmed w/ NP Anders SimmondsPete Babcock.

## 2017-03-21 NOTE — Progress Notes (Signed)
Subjective:  CRRT running- started angiotensin II- pressors down some but still on vaso and norepi- lactate peaked at 20- having bleeding on systemic heparin - ended up positive with fluid balance- CVP 14.  Wife at bedside, concerned that he looks uncomfortable- I told her that the progress made yesterday has not continued   Objective Vital signs in last 24 hours: Vitals:   03/09/17 0615 03/09/17 0630 2017/03/09 0645 03/09/17 0700  BP:      Pulse: (!) 103 (!) 105 (!) 109 (!) 108  Resp: 20 15 (!) 24 14  Temp: 99 F (37.2 C) 99.1 F (37.3 C) 99.3 F (37.4 C) 99.3 F (37.4 C)  TempSrc:      SpO2: 96% 100% 100% 100%  Weight:      Height:       Weight change: 25.9 kg (57 lb 1.6 oz)  Intake/Output Summary (Last 24 hours) at Mar 09, 2017 0720 Last data filed at Mar 09, 2017 0700  Gross per 24 hour  Intake         10941.91 ml  Output             6322 ml  Net          4619.91 ml    Assessment/ Plan: Pt is a 51 y.o. yo male DM/HTN/ETOH who was admitted on 03/03/2017 with likely septic shock- severe metabolic lactic acidosis and hemodynamic instability- c/b AKI Assessment/Plan: 1. Renal- AKI in the setting of severe hypotension.  No UOP- was on ACE as OP, no obstruction noted on imaging- currently being supported with CRRT-  2. [place]- K is better- adjust  - adjust dialysate 3. Anemia- not to a critical point just yet- supportive care- bleeding in the setting of systemic heparin 4. HTN/volume- CVP recorded at 14- not taking anything off with CRRT- but getting so much in- likely ending up positive which is OK for now 5. VDRF- per CCM 6. Sepsis- likely - on broad spectrum abx and multiple pressors as well as angiotensin II 7. Acidosis- lactate- on bicarb replacement fluid and also extra bicarb- pH now 7.2 with serum bicarb 17- I think would be Ok to stop additional bicarb drip for now 8. Dispo- I think wife is coming to grips with things - she knows that all of this medical support is not what he  would want   Abhay Godbolt A    Labs: Basic Metabolic Panel:  Recent Labs Lab 02/19/2017 1748 03/07/17 0452 03/07/17 1638 09-Mar-2017 0521 03/09/17 0526  NA 135 140 139 138  --   K >7.5* 4.0 3.0* 4.0  --   CL 95* 91* 92* 90*  --   CO2 <7* 10* 16* 17*  --   GLUCOSE 241* 347* 168* 150*  --   BUN 73* 49* 33* 30*  --   CREATININE 11.32* 7.55* 5.29* 4.29*  --   CALCIUM 6.7* 5.9* 5.5* 5.6*  --   PHOS 16.2*  --  2.1*  --  3.5   Liver Function Tests:  Recent Labs Lab 03/11/2017 1136  03/07/17 0452 03/07/17 1638 03-09-2017 0521  AST 137*  --  387*  --  677*  ALT 58  --  107*  --  136*  ALKPHOS 109  --  119  --  121  BILITOT 1.7*  --  3.1*  --  3.2*  PROT 5.1*  --  4.4*  --  4.4*  ALBUMIN 2.6*  < > 2.3* 2.3* 2.3*  < > = values in this interval not  displayed.  Recent Labs Lab 03/04/2017 1136 03/19/2017 2017  LIPASE 755* 770*  AMYLASE 527*  --    No results for input(s): AMMONIA in the last 168 hours. CBC:  Recent Labs Lab 03/15/2017 0859 03/15/2017 1136 03/07/17 0452 03/07/17 2025 04-01-2017 0521  WBC 5.6 8.1 5.1  --  13.1*  NEUTROABS 3.5  --   --   --  10.9*  HGB 9.2* 10.1* 9.3* 9.2* 8.7*  HCT 31.1* 32.9* 30.4* 27.9* 26.5*  MCV 123.4* 120.1* 114.7*  --  108.6*  PLT 89* 85* 65*  --  51*   Cardiac Enzymes:  Recent Labs Lab 03/05/2017 1136 03/07/17 0952  TROPONINI 0.09* 15.90*   CBG:  Recent Labs Lab 03/07/17 2008 03/07/17 2112 03/07/17 2216 03/07/17 2359 Apr 01, 2017 0340  GLUCAP 109* 113* 108* 119* 140*    Iron Studies: No results for input(s): IRON, TIBC, TRANSFERRIN, FERRITIN in the last 72 hours. Studies/Results: Ct Abdomen Pelvis Wo Contrast  Result Date: 02/27/2017 CLINICAL DATA:  Hypoxemia. Pt intubated. Unable to obtain hx from pt. Per RN pt was complaining of pain both sides. Pt has brown substance coming from OG tube. Pt has 15 lactic acid and 13.50 creatinine. Pt not on dialysis. EXAM: CT CHEST, ABDOMEN AND PELVIS WITHOUT CONTRAST TECHNIQUE:  Multidetector CT imaging of the chest, abdomen and pelvis was performed following the standard protocol without IV contrast. COMPARISON:  None. FINDINGS: CT CHEST FINDINGS Cardiovascular: No significant vascular findings. Normal heart size. No pericardial effusion. Coronary artery atherosclerosis in the LAD. Mediastinum/Nodes: No enlarged mediastinal, hilar, or axillary lymph nodes. Thyroid gland, trachea, and esophagus demonstrate no significant findings. Lungs/Pleura: Small bilateral pleural effusions. Bilateral lower lobe airspace disease which may reflect atelectasis versus pneumonia. Musculoskeletal: No acute osseous abnormality. No aggressive lytic or sclerotic osseous lesion. Anterior bridging thoracic spine osteophytes as can be seen with diffuse idiopathic skeletal hyperostosis. CT ABDOMEN PELVIS FINDINGS Hepatobiliary: Low attenuation of the liver as can be seen with hepatic steatosis. Normal gallbladder. Pancreas: Unremarkable. No pancreatic ductal dilatation or surrounding inflammatory changes. Spleen: Normal in size without focal abnormality. Adrenals/Urinary Tract: Normal adrenal glands. Normal kidneys. Mild nonspecific bilateral perinephric stranding. No urolithiasis or obstructive uropathy. Decompressed bladder. Stomach/Bowel: No bowel dilatation or bowel wall thickening. Sigmoid colon and descending colon diverticulosis without evidence of diverticulitis. No pneumatosis, pneumoperitoneum or portal venous gas. Nasogastric tube in the stomach. Mild fat stranding in the mesenteries. Vascular/Lymphatic: Normal caliber abdominal aorta. No lymphadenopathy. Reproductive: Prostate is unremarkable. Other: No abdominal wall hernia or abnormality. No abdominopelvic ascites. Musculoskeletal: No acute osseous abnormality. No lytic or sclerotic osseous lesion. Severe degenerative disc disease with disc height loss at L5-S1. Broad-based disc bulges at L4-5 and L5-S1. IMPRESSION: 1. Bilateral small pleural  effusions. Bilateral lower lobe airspace disease which may reflect atelectasis versus pneumonia. 2. Diverticulosis without evidence of diverticulitis. 3. Hepatic steatosis. 4. Mild mesenteric haziness which may be secondary to mild edema versus mild mesenteritis. Electronically Signed   By: Kathreen Devoid   On: 02/24/2017 08:49   Ct Chest Wo Contrast  Result Date: 02/27/2017 CLINICAL DATA:  Hypoxemia. Pt intubated. Unable to obtain hx from pt. Per RN pt was complaining of pain both sides. Pt has brown substance coming from OG tube. Pt has 15 lactic acid and 13.50 creatinine. Pt not on dialysis. EXAM: CT CHEST, ABDOMEN AND PELVIS WITHOUT CONTRAST TECHNIQUE: Multidetector CT imaging of the chest, abdomen and pelvis was performed following the standard protocol without IV contrast. COMPARISON:  None. FINDINGS: CT CHEST FINDINGS  Cardiovascular: No significant vascular findings. Normal heart size. No pericardial effusion. Coronary artery atherosclerosis in the LAD. Mediastinum/Nodes: No enlarged mediastinal, hilar, or axillary lymph nodes. Thyroid gland, trachea, and esophagus demonstrate no significant findings. Lungs/Pleura: Small bilateral pleural effusions. Bilateral lower lobe airspace disease which may reflect atelectasis versus pneumonia. Musculoskeletal: No acute osseous abnormality. No aggressive lytic or sclerotic osseous lesion. Anterior bridging thoracic spine osteophytes as can be seen with diffuse idiopathic skeletal hyperostosis. CT ABDOMEN PELVIS FINDINGS Hepatobiliary: Low attenuation of the liver as can be seen with hepatic steatosis. Normal gallbladder. Pancreas: Unremarkable. No pancreatic ductal dilatation or surrounding inflammatory changes. Spleen: Normal in size without focal abnormality. Adrenals/Urinary Tract: Normal adrenal glands. Normal kidneys. Mild nonspecific bilateral perinephric stranding. No urolithiasis or obstructive uropathy. Decompressed bladder. Stomach/Bowel: No bowel dilatation  or bowel wall thickening. Sigmoid colon and descending colon diverticulosis without evidence of diverticulitis. No pneumatosis, pneumoperitoneum or portal venous gas. Nasogastric tube in the stomach. Mild fat stranding in the mesenteries. Vascular/Lymphatic: Normal caliber abdominal aorta. No lymphadenopathy. Reproductive: Prostate is unremarkable. Other: No abdominal wall hernia or abnormality. No abdominopelvic ascites. Musculoskeletal: No acute osseous abnormality. No lytic or sclerotic osseous lesion. Severe degenerative disc disease with disc height loss at L5-S1. Broad-based disc bulges at L4-5 and L5-S1. IMPRESSION: 1. Bilateral small pleural effusions. Bilateral lower lobe airspace disease which may reflect atelectasis versus pneumonia. 2. Diverticulosis without evidence of diverticulitis. 3. Hepatic steatosis. 4. Mild mesenteric haziness which may be secondary to mild edema versus mild mesenteritis. Electronically Signed   By: Kathreen Devoid   On: 03/05/2017 08:49   US Renal  Result Date: 03/14/2017 CLINICAL DATA:  Elevated creatinine EXAM: RENAL / URINARY TRACT ULTRASOUND COMPLETE COMPARISON:  None. FINDINGS: Right Kidney: Length: 14.1 cm. Echogenicity within normal limits. No mass or hydronephrosis visualized. Left Kidney: Length: 14.4 cm. Echogenicity within normal limits. No mass or hydronephrosis visualized. Bladder: Decompressed by Foley catheter. IMPRESSION: No acute abnormality noted. Electronically Signed   By: Inez Catalina M.D.   On: 02/22/2017 12:32   Dg Chest Port 1 View  Result Date: 03/15/2017 CLINICAL DATA:  Central line placement EXAM: PORTABLE CHEST 1 VIEW COMPARISON:  02/18/2017 FINDINGS: Endotracheal tube tip at the clavicular heads. Left IJ central line with tip near the upper SVC. New right neck catheter with tip above the SVC level. An orogastric tube reaches the stomach at least. Worsening perihilar opacity, especially on the left. No evidence of pneumothorax. IMPRESSION: New  central line on the right with tip over the brachiocephalic level. No evidence of pneumothorax. Perihilar opacities with interval worsening on the left. Electronically Signed   By: Monte Fantasia M.D.   On: 03/07/2017 16:00   Dg Chest Portable 1 View  Result Date: 03/13/2017 CLINICAL DATA:  Status post central line placement EXAM: PORTABLE CHEST 1 VIEW COMPARISON:  03/11/2017 FINDINGS: Endotracheal tube, nasogastric catheter and left jugular central line are not seen. The central line tip is noted in the proximal superior vena cava. No pneumothorax is seen. Stable changes are noted in the right mid lung. No new focal abnormality is seen. IMPRESSION: Status post central line placement as described. The remainder of the study is stable from the previous exam. Electronically Signed   By: Inez Catalina M.D.   On: 02/19/2017 10:18   Dg Chest Portable 1 View  Result Date: 02/20/2017 CLINICAL DATA:  Hypoxia EXAM: PORTABLE CHEST 1 VIEW COMPARISON:  December 31, 2016 FINDINGS: Endotracheal tube tip is 4.0 cm above the carina. Nasogastric tube  tip and side port are in stomach. No pneumothorax. There is new atelectatic change in the mid lung. Lungs elsewhere are clear. Heart size and pulmonary vascularity are within normal limits. No adenopathy. No bone lesions. IMPRESSION: Tube positions as described without pneumothorax. Right midlung atelectasis. Lungs elsewhere clear. Heart size within normal limits. Electronically Signed   By: Lowella Grip III M.D.   On: 03/05/2017 08:05   Medications: Infusions: . sodium chloride    . angiotensin II (GIAPREZA) infusion 40 ng/kg/min (03/11/17 0717)  . anidulafungin Stopped (03/07/17 1947)  . calcium gluconate    . epinephrine Stopped (03/07/17 1309)  . fentaNYL infusion INTRAVENOUS 200 mcg/hr (03/07/17 2223)  . heparin 1,200 Units/hr (2017-03-11 0644)  . magnesium sulfate 1 - 4 g bolus IVPB 4 g (03/11/2017 0639)  . meropenem (MERREM) IV Stopped (Mar 11, 2017 0540)  .  norepinephrine (LEVOPHED) Adult infusion 100 mcg/min (2017/03/11 0538)  . dialysis replacement fluid (prismasate) 200 mL/hr at 03/07/17 1034  . dialysate (PRISMASATE) 1,500 mL/hr at 03/07/17 1732  . propofol (DIPRIVAN) infusion Stopped (02/27/2017 1023)  .  sodium bicarbonate  infusion 1000 mL 100 mL/hr at Mar 11, 2017 0215  . sodium bicarbonate (isotonic) 1000 mL infusion 300 mL/hr at Mar 11, 2017 0435  . sodium chloride    . vancomycin Stopped (03/07/17 0931)  . vasopressin (PITRESSIN) infusion - *FOR SHOCK* 0.03 Units/min (03/11/17 2536)    Scheduled Medications: . calcium chloride  1 g Intravenous Once  . chlorhexidine gluconate (MEDLINE KIT)  15 mL Mouth Rinse BID  . dextrose  50 mL Intravenous Once  . fentaNYL (SUBLIMAZE) injection  50 mcg Intravenous Once  . hydrocortisone sod succinate (SOLU-CORTEF) inj  50 mg Intravenous Q6H  . insulin aspart  0-9 Units Subcutaneous Q4H  . mouth rinse  15 mL Mouth Rinse QID  . pantoprazole (PROTONIX) IV  40 mg Intravenous QHS    have reviewed scheduled and prn medications.  Physical Exam: General: less alert today  Heart:RRR Lungs: CBS bilat Abdomen: distended but non tender Extremities: minimal edema Dialysis Access: right HD vascath placed 5/17   11-Mar-2017,7:20 AM  LOS: 2 days

## 2017-03-21 NOTE — Progress Notes (Signed)
PHARMACY - PHYSICIAN COMMUNICATION CRITICAL VALUE ALERT - BLOOD CULTURE IDENTIFICATION (BCID)  Results for orders placed or performed during the hospital encounter of 02/25/2017  Blood Culture ID Panel (Reflexed) (Collected: 03/01/2017  8:37 AM)  Result Value Ref Range   Enterococcus species NOT DETECTED NOT DETECTED   Listeria monocytogenes NOT DETECTED NOT DETECTED   Staphylococcus species NOT DETECTED NOT DETECTED   Staphylococcus aureus NOT DETECTED NOT DETECTED   Streptococcus species NOT DETECTED NOT DETECTED   Streptococcus agalactiae NOT DETECTED NOT DETECTED   Streptococcus pneumoniae NOT DETECTED NOT DETECTED   Streptococcus pyogenes NOT DETECTED NOT DETECTED   Acinetobacter baumannii NOT DETECTED NOT DETECTED   Enterobacteriaceae species NOT DETECTED NOT DETECTED   Enterobacter cloacae complex NOT DETECTED NOT DETECTED   Escherichia coli NOT DETECTED NOT DETECTED   Klebsiella oxytoca NOT DETECTED NOT DETECTED   Klebsiella pneumoniae NOT DETECTED NOT DETECTED   Proteus species NOT DETECTED NOT DETECTED   Serratia marcescens NOT DETECTED NOT DETECTED   Haemophilus influenzae NOT DETECTED NOT DETECTED   Neisseria meningitidis NOT DETECTED NOT DETECTED   Pseudomonas aeruginosa NOT DETECTED NOT DETECTED   Candida albicans NOT DETECTED NOT DETECTED   Candida glabrata NOT DETECTED NOT DETECTED   Candida krusei NOT DETECTED NOT DETECTED   Candida parapsilosis NOT DETECTED NOT DETECTED   Candida tropicalis NOT DETECTED NOT DETECTED   Called from [facility] with 1 blood culture with Gram positive rods. Nothing detected on BCID. On review of chart, patient remains on maximal treatment for suspected intraabdominal sepsis. This blood culture information does not change his treatment course.  Name of physician (or Provider) Contacted: [id number] pharmacist will discuss on CCM rounds  Changes to prescribed antibiotics required: none - continue broad coverage with vancomycin, meropenem, and  anidulafungin for suspected intraabdominal sepsis.  Sallee Provencalurner, Andres Sanchez April 25, 2017  10:01 AM

## 2017-03-21 NOTE — Progress Notes (Signed)
eLink Physician-Brief Progress Note Patient Name: Andres RileyJohn R Kniss Jr. DOB: 1965-11-29 MRN: [id number]   Date of Service  02-08-2017  HPI/Events of Note  hypomag  eICU Interventions  Mag replaced     Intervention Category Intermediate Interventions: Electrolyte abnormality - evaluation and management  [name] 02-08-2017, 6:26 AM

## 2017-03-21 NOTE — Progress Notes (Signed)
ANTICOAGULATION CONSULT NOTE - Follow Up Consult  Pharmacy Consult for heparin Indication: chest pain/ACS  Labs:  Recent Labs  03/17/2017 1136  03/07/17 0452 03/07/17 0952 03/07/17 1638 03/07/17 1800 03/07/17 2025 Apr 03, 2017 0521 Apr 03, 2017 0522  HGB 10.1*  --  9.3*  --   --   --  9.2* 8.7*  --   HCT 32.9*  --  30.4*  --   --   --  27.9* 26.5*  --   PLT 85*  --  65*  --   --   --   --  51*  --   APTT 56*  --   --   --   --   --   --   --   --   LABPROT 25.6*  --   --  21.1*  --   --   --  33.1*  --   INR 2.29  --   --  1.79  --   --   --  3.16  --   HEPARINUNFRC  --   --   --   --   --  0.53  --   --  0.86*  CREATININE 11.88*  < > 7.55*  --  5.29*  --   --  4.29*  --   TROPONINI 0.09*  --   --  15.90*  --   --   --   --   --   < > = values in this interval not displayed.   Assessment: 51yo male now above goal on heparin after one level at goal; has not been getting heparin w/ CRRT; RN does report bleeding around CVL, hematuria, and bloody BM.  Goal of Therapy:  Heparin level 0.3-0.7 units/ml   Plan:  Will decrease heparin gtt by 3 units/kgABW/hr to 1200 units/hr and check level in 8hr.  Vernard GamblesVeronda Maiko Salais, PharmD, BCPS  01/11/17,6:40 AM

## 2017-03-21 NOTE — Progress Notes (Signed)
Family and pt requesting transition to comfort Plan Will stop CRRT Dc pressors, abx and vent support when we have achieved adequate comfort.   Simonne MartinetPeter E Deaven Barron ACNP-BC [facility] Pager # [phone] OR # [phone] if no answer

## 2017-03-21 NOTE — Discharge Summary (Signed)
Physician Discharge Summary  Patient ID: Polo RileyJohn R Lietzke Jr. MRN: [id number] DOB/AGE: 1966-07-03 51 y.o.  Admit date: Sep 23, 2017 Discharge date: 03/20/2017  Admission Diagnoses: Shock  Discharge Diagnoses:  Active Problems:   Sepsis (HCC)   Hypoxemia   Septic shock (HCC)  Multiorgan failure Acute renal failure  Discharged Condition: Deceased  Hospital Course:  51 yo WM with a history of Angina , morbid obesity who per his wife became ill over the the weekend with malaise , fever and pain bilateral ribs. He continued to decline and EMS was activated 5/17 and when EMS arrived he was awake , alert and cooperative. In route to hospital he became unresponsive, bradycardic and required external pacemaker. In ED he was hypothermic (33F)  bradycardic 58, hypotensive and was intubated by EDP and PCCM was consulted. His PH was 6.7, creatine 12.07, K+ 7.2. CVL and aline were placed, bicarb drip , abx  , vasopressors started.  During the hospital course he developed bleeding from multiple sites and pressor requirements increased. After a long discussion with the wife regarding deteriorating course and poor prognosis they decided to transition to comfort care.  Consults: nephrology, cardiology  Disposition: 20-Expired   Allergies as of 03/12/2017   No Known Allergies     Medication List    ASK your doctor about these medications   aspirin 81 MG chewable tablet Chew 81 mg by mouth daily.   atorvastatin 40 MG tablet Commonly known as:  LIPITOR Take 40 mg by mouth daily.   benazepril 5 MG tablet Commonly known as:  LOTENSIN Take 5 mg by mouth daily.   folic acid 1 MG tablet Commonly known as:  FOLVITE Take 1 mg by mouth daily.   metFORMIN 1000 MG tablet Commonly known as:  GLUCOPHAGE Take 1,000 mg by mouth 2 (two) times daily with a meal.   metoprolol succinate 50 MG 24 hr tablet Commonly known as:  TOPROL-XL Take 50 mg by mouth 2 (two) times daily. Take with or immediately  following a meal.   multivitamin tablet Take 1 tablet by mouth daily.   ranitidine 150 MG tablet Commonly known as:  ZANTAC Take 150 mg by mouth at bedtime.   traMADol 50 MG tablet Commonly known as:  ULTRAM Take 50 mg by mouth every 4 (four) hours as needed for moderate pain.        Signed: Nehal Shives 03/20/2017, 4:42 AM

## 2017-03-21 NOTE — Progress Notes (Signed)
PULMONARY / CRITICAL CARE MEDICINE   Name: Andres Sanchez. MRN: [id number] DOB: May 14, 1966    ADMISSION DATE:  24-Apr-2017   REFERRING MD:  EDP  CHIEF COMPLAINT: bilateral rib pain  HISTORY OF PRESENT ILLNESS:    51 yo WM with a history of Angina , morbid obesity who per his wife became ill over the the weekend with malaise , fever and pain bilateral ribs. He continued to decline and EMS was activated 5/17 and when EMS arrived he was awake , alert and cooperative. In route to hospital he became unresponsive, bradycardic and required external pacemaker. In ED he was hypothermic (4F)  bradycardic 58, hypotensive and was intubated by EDP and PCCM was consulted. His PH was 6.7, creatine 12.07, K+ 7.2. CVL and aline were placed, bicarb drip , abx  , vasopressors started. His prognosis appears grim.  SUBJECTIVE:  -requiring more pressors -bloody BMs -c/o RLQ pain   VITAL SIGNS: BP (!) 91/54   Pulse (!) 108   Temp 99.3 F (37.4 C)   Resp 14   Ht 5\' 11"  (1.803 m)   Wt (!) 321 lb 10.4 oz (145.9 kg)   SpO2 100%   BMI 44.86 kg/m   HEMODYNAMICS:    VENTILATOR SETTINGS: Vent Mode: PRVC FiO2 (%):  [40 %-50 %] 40 % Set Rate:  [35 bmp] 35 bmp Vt Set:  [600 mL] 600 mL PEEP:  [5 cmH20] 5 cmH20 Plateau Pressure:  [22 cmH20-25 cmH20] 22 cmH20  INTAKE / OUTPUT: I/O last 3 completed shifts: In: 15745.2 [I.V.:12011.2; IV Piggyback:3734] Out: 6296 [Urine:5; Emesis/NG output:100; Other:6191]  General appearance:  51 Year old  Male. Critically ill. Mottled. On multiple pressors Eyes: anicteric sclerae, moist conjunctivae; PERRL, EOMI bilaterally. Mouth:  membranes and no mucosal ulcerations; normal hard and soft palate, orally intubated Neck: Trachea midline; neck supple, no JVD Lungs/chest: CTA, with normal respiratory effort and no intercostal retractions CV: RRR, no MRGs  Abdomen: Soft, tender; no masses or HSM Extremities:generalized edema  Skin: cool & mottled Neuro/Psych:  weak, opens eyes. f/c    LABS:  BMET  Recent Labs Lab 03/07/17 0452 03/07/17 1638 03/15/2017 0521  NA 140 139 138  K 4.0 3.0* 4.0  CL 91* 92* 90*  CO2 10* 16* 17*  BUN 49* 33* 30*  CREATININE 7.55* 5.29* 4.29*  GLUCOSE 347* 168* 150*   Electrolytes  Recent Labs Lab 10/17/17 1136 10/17/17 1748 03/07/17 0452 03/07/17 1638 03/02/2017 0521 02/24/2017 0526  CALCIUM 7.3* 6.7* 5.9* 5.5* 5.6*  --   MG 2.6*  --  1.9  --  1.5*  --   PHOS 14.7* 16.2*  --  2.1*  --  3.5   CBC  Recent Labs Lab 10/17/17 1136 03/07/17 0452 03/07/17 2025 03/01/2017 0521  WBC 8.1 5.1  --  13.1*  HGB 10.1* 9.3* 9.2* 8.7*  HCT 32.9* 30.4* 27.9* 26.5*  PLT 85* 65*  --  51*   Coag's  Recent Labs Lab 10/17/17 1136 03/07/17 0952 03/18/2017 0521  APTT 56*  --   --   INR 2.29 1.79 3.16    Sepsis Markers  Recent Labs Lab 10/17/17 1136  03/07/17 0948 03/07/17 1343 03/07/17 1639  LATICACIDVEN  --   < > 21.0* 18.9* 17.4*  PROCALCITON 0.39  --   --   --   --   < > = values in this interval not displayed. ABG  Recent Labs Lab 03/07/17 0425 03/07/17 0839 03/07/17 1133  PHART 7.129* 7.212* 7.230*  PCO2ART 29.8* 31.6* 27.9*  PO2ART 68.7* 92.0 60.0*    Liver Enzymes  Recent Labs Lab Mar 13, 2017 1136  03/07/17 0452 03/07/17 1638 02/20/2017 0521  AST 137*  --  387*  --  677*  ALT 58  --  107*  --  136*  ALKPHOS 109  --  119  --  121  BILITOT 1.7*  --  3.1*  --  3.2*  ALBUMIN 2.6*  < > 2.3* 2.3* 2.3*  < > = values in this interval not displayed. Cardiac Enzymes  Recent Labs Lab Mar 13, 2017 1136 03/07/17 0952  TROPONINI 0.09* 15.90*    Glucose  Recent Labs Lab 03/07/17 2008 03/07/17 2112 03/07/17 2216 03/07/17 2359 02/20/2017 0340 03/18/2017 0738  GLUCAP 109* 113* 108* 119* 140* 155*    Imaging Dg Chest Port 1 View  Result Date: 02/26/2017 CLINICAL DATA:  Sepsis. Endotracheal tube placement. EXAM: PORTABLE CHEST 1 VIEW COMPARISON:  One-view chest x-ray Mar 13, 2017 FINDINGS:  The heart is enlarged. Endotracheal tube terminates 6.2 cm above the carina. Is at the level of the clavicles. A left IJ line is stable. A right IJ sheath is stable. Mild interstitial edema is similar the prior exam. Bilateral pleural effusions are noted. Bibasilar airspace disease is worse on the left. While this may represent atelectasis, infection is not excluded. IMPRESSION: 1. Support apparatus is stable. 2. Similar appearance of bilateral interstitial and airspace disease. 3. Asymmetric left lower lobe airspace disease is concerning for left lower lobe pneumonia. Electronically Signed   By: Marin Roberts M.D.   On: 03/06/2017 07:36     STUDIES:  5/17 2 d >> 5/17 renal US>>neg  CULTURES: 5/17 sputum>> 5/17 bc x 2>>GPR (only one bottle)>>> 5/17 uc>> 5/18: RVP negative  ANTIBIOTICS: 5/17 vanc>> 5/17 cefepime>>5/17 5/17 meropenem>>> 5/17 eraxis>>>  SIGNIFICANT EVENTS: 5/17 admit in severe septic shock, MODS, cvvhd  LINES/TUBES: 5/17 ET>> 5/17 LIJVCL>> 5/17 Rt Fem Aline>>  ASSESSMENT / PLAN:  PULMONARY A: VDRF secondary to sepsis/shock -PCXR personally reviewed 5/19: support lines/tubes intact. Increased interstitial changes. Basilar atx evolving ALI vs edema -Hemodynamically not ready to wean  P:   Cont full vent support for now No change in Ve given refractory acidosis May be looking at one-way extubation   CARDIOVASCULAR A:  Shock/sepsis- refractory Hx on angina  Bradycardia resolved  P:  Stop heparin d/t GIB  No escalation of pressors Family deciding of goals  RENAL A:   Acute renal failure Refractory lactic acidosis Fluid and electrolyte imbalance: hypocalcemia P:   Cont CRRT for now   GASTROINTESTINAL A:   GI protection Possible abd source of infection-->favoring ischemic gut given clinical picture Shock liver w/ rising LFTs GIB; suspect in setting of ischemia  P:   Stop heparin  CT abd/pelvis IF family desires to continue care Bowel  rest  HEMATOLOGIC A:   Anemia-->hgb dropped 2 gms since 5/17. Suspect that this is a mix of dilution and blood loss from GIB Thrombocytopenia (sepsis, dilution) P:  Stop heparin gtt Back to [place] heparin Trend cbc Transfuse as needed IF family desires to cont rx  INFECTIOUS A:   Presumed infectious without obvious source Abdo? 1 of 2 BC positive. Suspect contamination  P:   No change in abx  ENDOCRINE A:   Monitor glucose ? Adrenal insuff  P:   Cont stress dose steroids ssi   NEUROLOGIC A:   Obtunded from presumed metabolic encephalopathy - P:   Cont supportive care    FAMILY  - Updates:  updated wife at bedside  - Inter-disciplinary family meet or Palliative Care meeting due by:  day 7  Discussion Refractory shock/mods. Suspect abd sepsis, favor ischemia. Now w/ GIB on heparin. Pressor needs up. Long d/w wife. Best case scenario here is we image abd-->find something that can be operated on-->if he survives has long post-op course w/ several unknowns and high likelihood of long term disability. She wants to discuss options w/ family but feels strongly that Mr Bagheri would not want to continue rx and would instruct her to transition to comfort. Will await family discussion  My ccm time 60 minutes.   Simonne Martinet ACNP-BC [facility] Pulmonary/Critical Care Pager # [phone] OR # [phone] if no answer

## 2017-03-21 NOTE — Progress Notes (Signed)
eLink Physician-Brief Progress Note Patient Name: Andres RileyJohn R Pape Jr. DOB: Dec 20, 1965 MRN: [id number]   Date of Service  2016-12-14  HPI/Events of Note  hypocalcemia  eICU Interventions  Calcium replaced     Intervention Category Intermediate Interventions: Electrolyte abnormality - evaluation and management  Jereme Loren 2016-12-14, 6:47 AM

## 2017-03-21 DEATH — deceased

## 2018-05-07 IMAGING — CT CT ABD-PELV W/O CM
2 of 5 series · 15 of 46 positions shown, 17 images · non-contrast
Comparison: None.

CLINICAL DATA: Hypoxemia. Pt intubated. Unable to obtain hx from
pt. Per RN pt was complaining of pain both sides. Pt has brown
substance coming from OG tube. Pt has 15 lactic acid and
creatinine. Pt not on dialysis.

EXAM:
CT CHEST, ABDOMEN AND PELVIS WITHOUT CONTRAST
TECHNIQUE: Multidetector CT imaging of the chest, abdomen and pelvis was
performed following the standard protocol without IV contrast.

[Series 5: cap w/o 3.0 mm st cor · coronal · non-contrast · 0.93mm/px · 3 of 106 slices shown]
[im 36/106  soft-tissue]
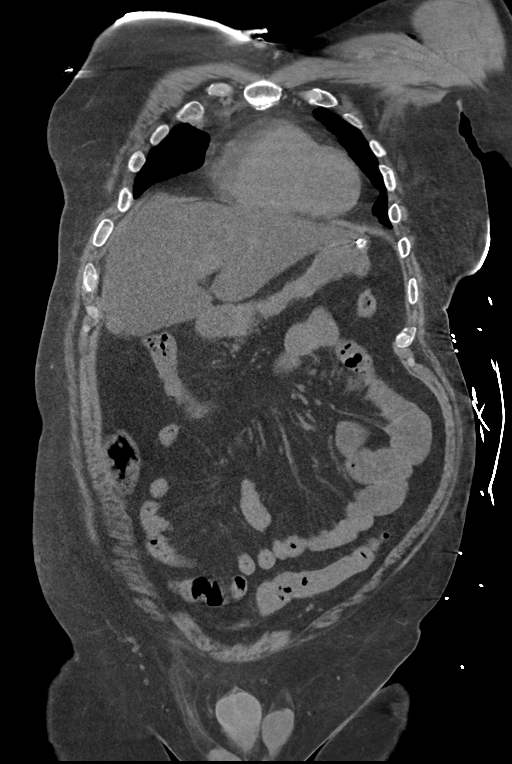
[im 47/106  soft-tissue]
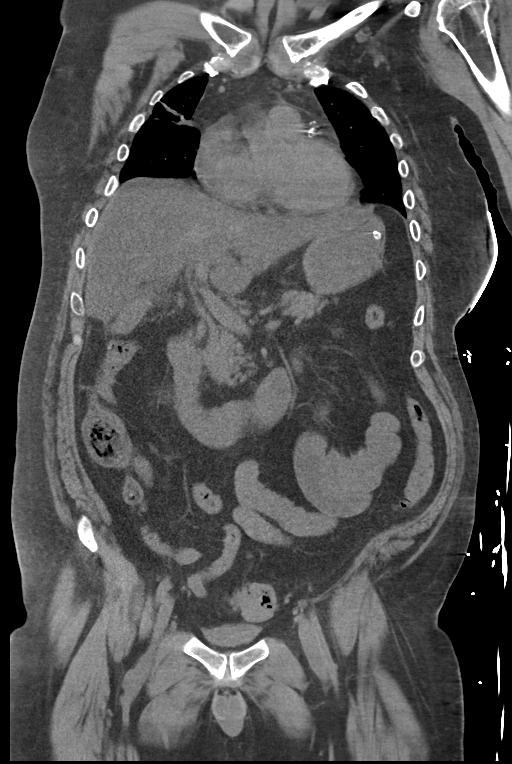
[im 59/106  soft-tissue]
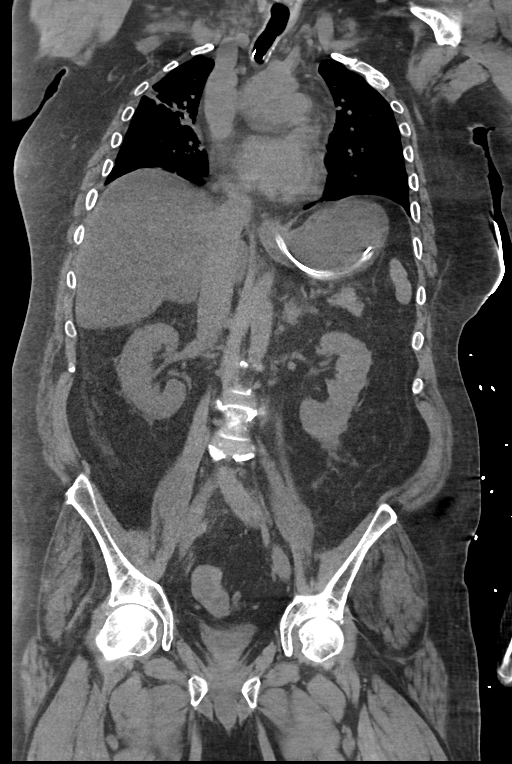

[Series 8: cap w/o 2.0 mm st · axial · non-contrast · 0.93mm/px · z∈[-1042,-412]mm · 12 of 357 slices shown, 14 images]
[im 21/357  soft-tissue]
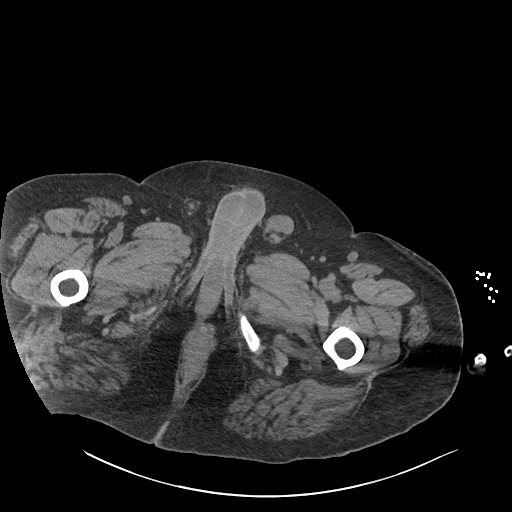
[im 21/357  bone]
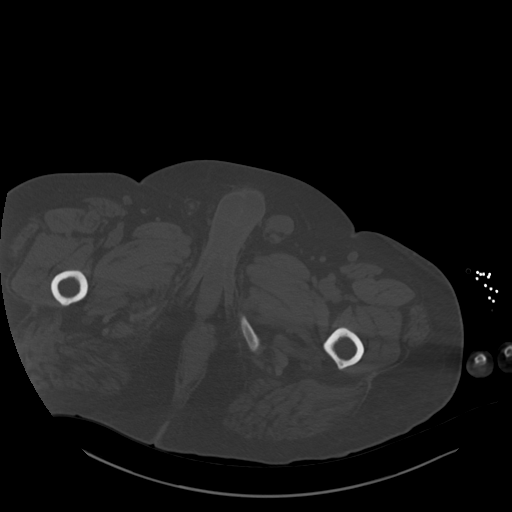
[im 63/357  soft-tissue]
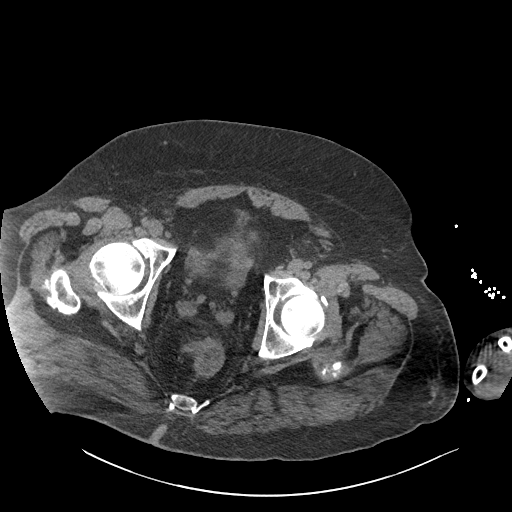
[im 84/357  soft-tissue]
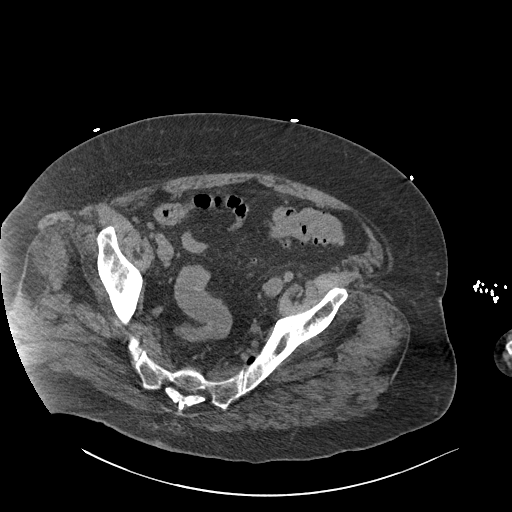
[im 105/357  soft-tissue]
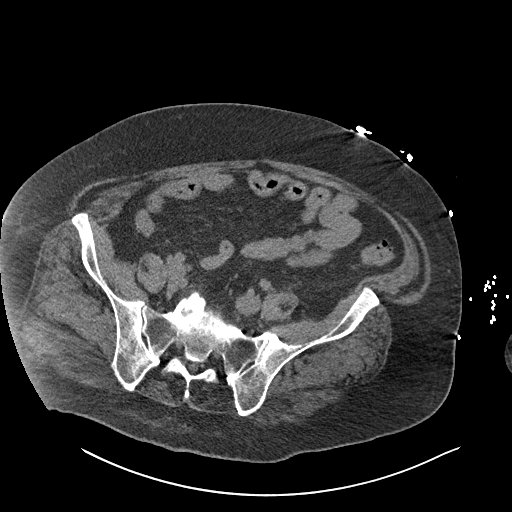
[im 147/357  soft-tissue]
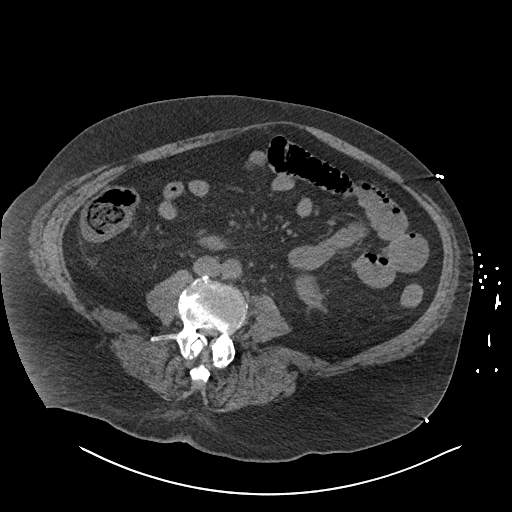
[im 168/357  soft-tissue]
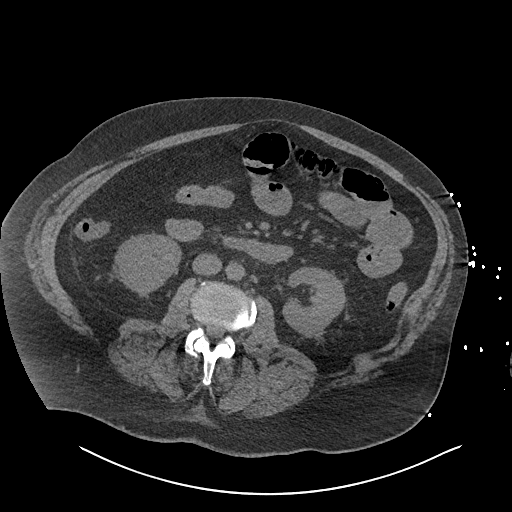
[im 189/357  soft-tissue]
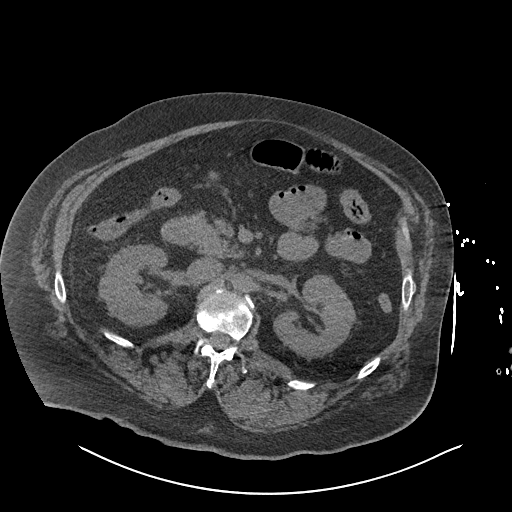
[im 231/357  soft-tissue]
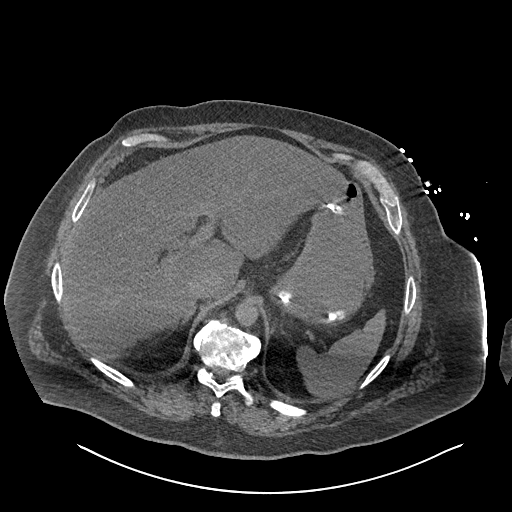
[im 252/357  soft-tissue]
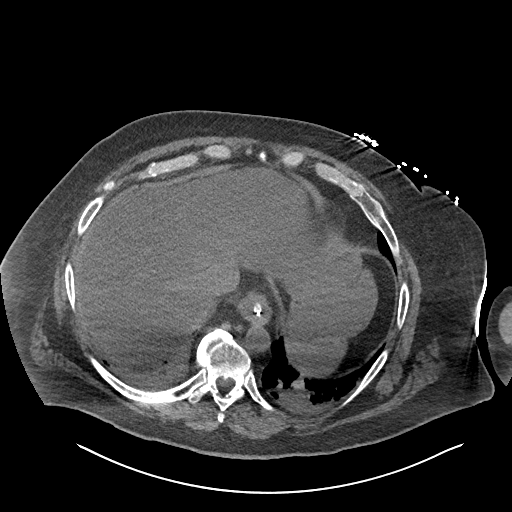
[im 252/357  bone]
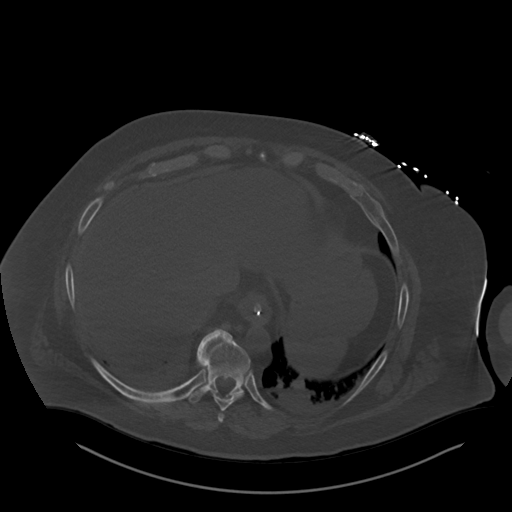
[im 273/357  soft-tissue]
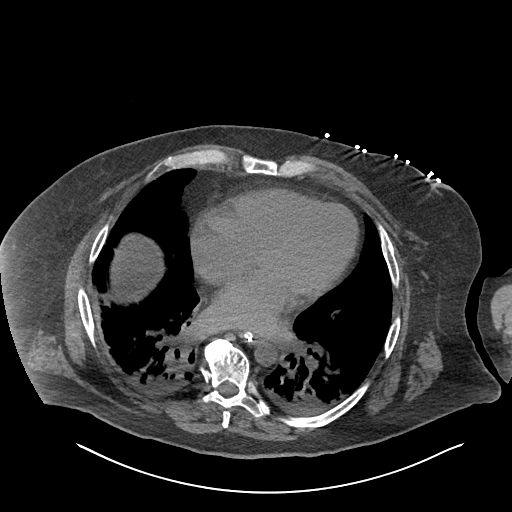
[im 315/357  soft-tissue]
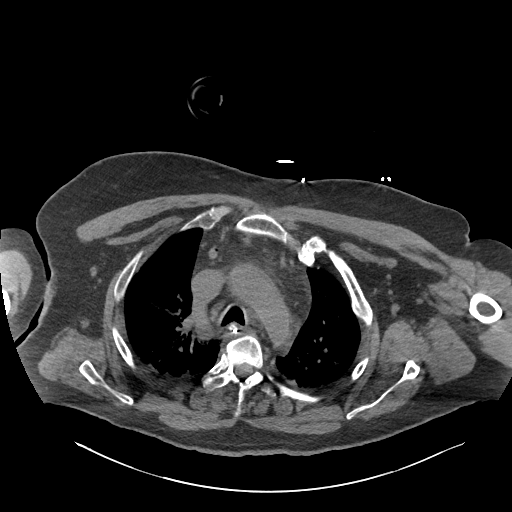
[im 336/357  soft-tissue]
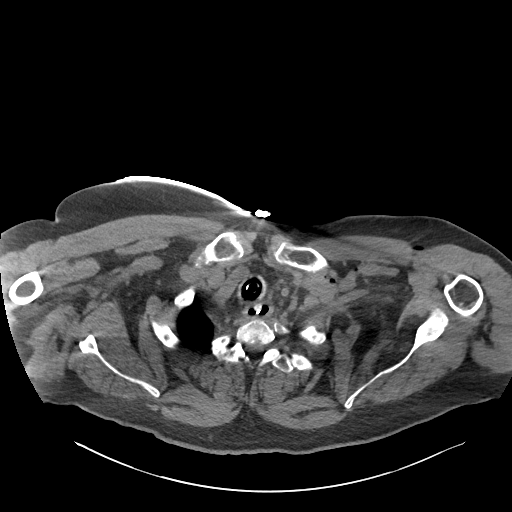

[15 of 46 positions shown; findings below may reference images not displayed]

FINDINGS: CT CHEST FINDINGS

Cardiovascular: No significant vascular findings. Normal heart size.
No pericardial effusion. Coronary artery atherosclerosis in the LAD.

Mediastinum/Nodes: No enlarged mediastinal, hilar, or axillary lymph
nodes. Thyroid gland, trachea, and esophagus demonstrate no
significant findings.

Lungs/Pleura: Small bilateral pleural effusions. Bilateral lower
lobe airspace disease which may reflect atelectasis versus
pneumonia.

Musculoskeletal: No acute osseous abnormality. No aggressive lytic
or sclerotic osseous lesion. Anterior bridging thoracic spine
osteophytes as can be seen with diffuse idiopathic skeletal
hyperostosis.

CT ABDOMEN PELVIS FINDINGS

Hepatobiliary: Low attenuation of the liver as can be seen with
hepatic steatosis. Normal gallbladder.

Pancreas: Unremarkable. No pancreatic ductal dilatation or
surrounding inflammatory changes.

Spleen: Normal in size without focal abnormality.

Adrenals/Urinary Tract: Normal adrenal glands. Normal kidneys. Mild
nonspecific bilateral perinephric stranding. No urolithiasis or
obstructive uropathy. Decompressed bladder.

Stomach/Bowel: No bowel dilatation or bowel wall thickening. Sigmoid
colon and descending colon diverticulosis without evidence of
diverticulitis. No pneumatosis, pneumoperitoneum or portal venous
gas. Nasogastric tube in the stomach. Mild fat stranding in the
mesenteries.

Vascular/Lymphatic: Normal caliber abdominal aorta. No
lymphadenopathy.

Reproductive: Prostate is unremarkable.

Other: No abdominal wall hernia or abnormality. No abdominopelvic
ascites.

Musculoskeletal: No acute osseous abnormality. No lytic or sclerotic
osseous lesion. Severe degenerative disc disease with disc height
loss at L5-S1. Broad-based disc bulges at L4-5 and L5-S1.
IMPRESSION: 1. Bilateral small pleural effusions. Bilateral lower lobe airspace
disease which may reflect atelectasis versus pneumonia.
2. Diverticulosis without evidence of diverticulitis.
3. Hepatic steatosis.
4. Mild mesenteric haziness which may be secondary to mild edema
versus mild mesenteritis.

## 2018-05-09 IMAGING — CR DG CHEST 1V PORT
1 series · 1 of 1 positions shown · non-contrast
Comparison: One-view chest x-ray 03/06/2017

CLINICAL DATA: Sepsis. Endotracheal tube placement.

EXAM:
PORTABLE CHEST 1 VIEW

[AP]
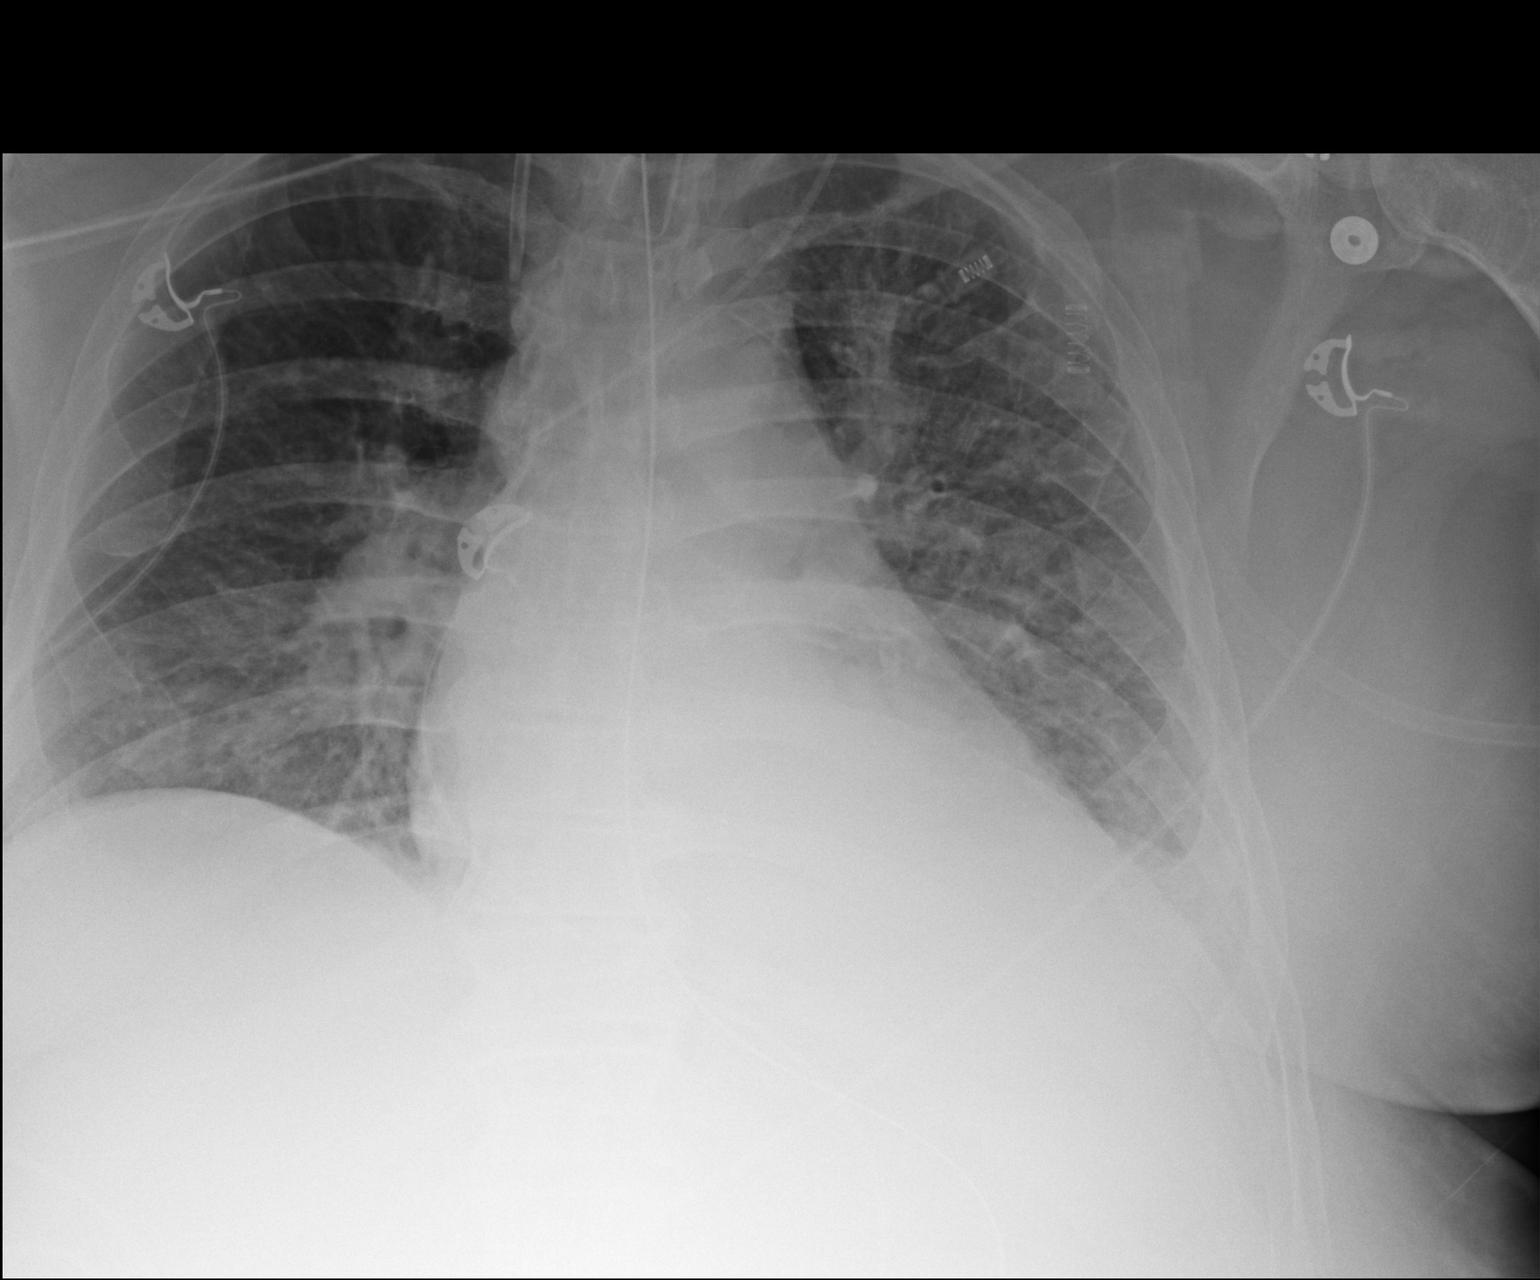

[1 of 1 positions shown; findings below may reference images not displayed]

FINDINGS: The heart is enlarged. Endotracheal tube terminates 6.2 cm above the
carina. Is at the level of the clavicles. A left IJ line is stable.
A right IJ sheath is stable.

Mild interstitial edema is similar the prior exam. Bilateral pleural
effusions are noted. Bibasilar airspace disease is worse on the
left. While this may represent atelectasis, infection is not
excluded.
IMPRESSION: 1. Support apparatus is stable.
2. Similar appearance of bilateral interstitial and airspace
disease.
3. Asymmetric left lower lobe airspace disease is concerning for
left lower lobe pneumonia.
